# Patient Record
Sex: Female | Born: 1990 | Race: Black or African American | Hispanic: No | Marital: Single | Smoking: Never smoker
Health system: Southern US, Community
[De-identification: ages and names within clinical notes are randomized; demographics above are authoritative.]

## PROBLEM LIST (undated history)

## (undated) ENCOUNTER — Inpatient Hospital Stay (HOSPITAL_COMMUNITY): Payer: Self-pay

## (undated) DIAGNOSIS — F419 Anxiety disorder, unspecified: Secondary | ICD-10-CM

## (undated) DIAGNOSIS — N83209 Unspecified ovarian cyst, unspecified side: Secondary | ICD-10-CM

## (undated) DIAGNOSIS — Z202 Contact with and (suspected) exposure to infections with a predominantly sexual mode of transmission: Secondary | ICD-10-CM

## (undated) DIAGNOSIS — E559 Vitamin D deficiency, unspecified: Secondary | ICD-10-CM

## (undated) DIAGNOSIS — R87629 Unspecified abnormal cytological findings in specimens from vagina: Secondary | ICD-10-CM

## (undated) DIAGNOSIS — Z8619 Personal history of other infectious and parasitic diseases: Secondary | ICD-10-CM

## (undated) DIAGNOSIS — B379 Candidiasis, unspecified: Secondary | ICD-10-CM

## (undated) HISTORY — DX: Personal history of other infectious and parasitic diseases: Z86.19

## (undated) HISTORY — PX: DILATION AND CURETTAGE OF UTERUS: SHX78

## (undated) HISTORY — PX: WISDOM TOOTH EXTRACTION: SHX21

## (undated) HISTORY — DX: Candidiasis, unspecified: B37.9

## (undated) HISTORY — DX: Anxiety disorder, unspecified: F41.9

## (undated) HISTORY — DX: Contact with and (suspected) exposure to infections with a predominantly sexual mode of transmission: Z20.2

## (undated) HISTORY — DX: Vitamin D deficiency, unspecified: E55.9

## (undated) HISTORY — DX: Unspecified ovarian cyst, unspecified side: N83.209

## (undated) HISTORY — DX: Unspecified abnormal cytological findings in specimens from vagina: R87.629

---
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
## 2010-01-02 ENCOUNTER — Inpatient Hospital Stay (HOSPITAL_COMMUNITY): Admission: AD | Admit: 2010-01-02 | Discharge: 2010-01-05 | Payer: Self-pay | Admitting: Obstetrics and Gynecology

## 2010-11-20 LAB — CBC
HCT: 31.9 % — ABNORMAL LOW (ref 36.0–46.0)
MCV: 90.7 fL (ref 78.0–100.0)
Platelets: 206 10*3/uL (ref 150–400)
RBC: 3.99 MIL/uL (ref 3.87–5.11)
RDW: 13.5 % (ref 11.5–15.5)
WBC: 15.4 10*3/uL — ABNORMAL HIGH (ref 4.0–10.5)

## 2010-11-20 LAB — CCBB MATERNAL DONOR DRAW

## 2010-12-06 LAB — URINALYSIS, ROUTINE W REFLEX MICROSCOPIC
Bilirubin Urine: NEGATIVE
Glucose, UA: NEGATIVE mg/dL
Ketones, ur: 40 mg/dL — AB
Leukocytes, UA: NEGATIVE
Leukocytes, UA: NEGATIVE
Nitrite: NEGATIVE
Protein, ur: NEGATIVE mg/dL
Specific Gravity, Urine: 1.015 (ref 1.005–1.030)
Urobilinogen, UA: 0.2 mg/dL (ref 0.0–1.0)
Urobilinogen, UA: 0.2 mg/dL (ref 0.0–1.0)
pH: 7 (ref 5.0–8.0)
pH: 6 (ref 5.0–8.0)

## 2010-12-06 LAB — WET PREP, GENITAL
Trich, Wet Prep: NONE SEEN
Yeast Wet Prep HPF POC: NONE SEEN

## 2010-12-06 LAB — URINE MICROSCOPIC-ADD ON

## 2010-12-06 LAB — GC/CHLAMYDIA PROBE AMP, GENITAL: Chlamydia, DNA Probe: NEGATIVE

## 2010-12-07 LAB — URINE MICROSCOPIC-ADD ON

## 2010-12-07 LAB — WET PREP, GENITAL
Trich, Wet Prep: NONE SEEN
Yeast Wet Prep HPF POC: NONE SEEN

## 2010-12-07 LAB — URINALYSIS, ROUTINE W REFLEX MICROSCOPIC
Glucose, UA: NEGATIVE mg/dL
Specific Gravity, Urine: 1.025 (ref 1.005–1.030)
Urobilinogen, UA: 0.2 mg/dL (ref 0.0–1.0)
pH: 6 (ref 5.0–8.0)

## 2010-12-07 LAB — CBC
MCHC: 33.8 g/dL (ref 31.0–37.0)
Platelets: 283 10*3/uL (ref 150–400)
RDW: 12.6 % (ref 11.4–15.5)

## 2010-12-07 LAB — GC/CHLAMYDIA PROBE AMP, GENITAL
Chlamydia, DNA Probe: NEGATIVE
GC Probe Amp, Genital: NEGATIVE

## 2010-12-11 LAB — URINE CULTURE

## 2010-12-11 LAB — URINALYSIS, ROUTINE W REFLEX MICROSCOPIC
Glucose, UA: NEGATIVE mg/dL
Ketones, ur: NEGATIVE mg/dL
Leukocytes, UA: NEGATIVE
Nitrite: NEGATIVE
Specific Gravity, Urine: 1.015 (ref 1.005–1.030)
pH: 7 (ref 5.0–8.0)

## 2010-12-11 LAB — URINE MICROSCOPIC-ADD ON: WBC, UA: NONE SEEN WBC/hpf (ref <3)

## 2011-06-07 LAB — WET PREP, GENITAL

## 2011-06-07 LAB — URINE CULTURE

## 2011-06-07 LAB — URINALYSIS, ROUTINE W REFLEX MICROSCOPIC
Glucose, UA: NEGATIVE mg/dL
Ketones, ur: NEGATIVE mg/dL
Leukocytes, UA: NEGATIVE
Nitrite: NEGATIVE
pH: 8 (ref 5.0–8.0)

## 2011-06-07 LAB — URINE MICROSCOPIC-ADD ON

## 2011-06-07 LAB — PREGNANCY, URINE: Preg Test, Ur: NEGATIVE

## 2011-06-07 LAB — GC/CHLAMYDIA PROBE AMP, GENITAL: Chlamydia, DNA Probe: NEGATIVE

## 2012-03-27 ENCOUNTER — Ambulatory Visit: Payer: Self-pay | Admitting: Obstetrics and Gynecology

## 2012-04-06 ENCOUNTER — Encounter: Payer: Medicaid Other | Admitting: Obstetrics and Gynecology

## 2012-05-25 ENCOUNTER — Encounter: Payer: Self-pay | Admitting: Obstetrics and Gynecology

## 2012-05-25 ENCOUNTER — Ambulatory Visit (INDEPENDENT_AMBULATORY_CARE_PROVIDER_SITE_OTHER): Payer: Medicaid Other | Admitting: Obstetrics and Gynecology

## 2012-05-25 ENCOUNTER — Other Ambulatory Visit (INDEPENDENT_AMBULATORY_CARE_PROVIDER_SITE_OTHER): Payer: Medicaid Other

## 2012-05-25 VITALS — BP 100/72 | HR 72 | Ht 66.5 in | Wt 128.0 lb

## 2012-05-25 DIAGNOSIS — Z3009 Encounter for other general counseling and advice on contraception: Secondary | ICD-10-CM

## 2012-05-25 DIAGNOSIS — Z3040 Encounter for surveillance of contraceptives, unspecified: Secondary | ICD-10-CM

## 2012-05-25 DIAGNOSIS — Z00129 Encounter for routine child health examination without abnormal findings: Secondary | ICD-10-CM

## 2012-05-25 DIAGNOSIS — Z01419 Encounter for gynecological examination (general) (routine) without abnormal findings: Secondary | ICD-10-CM

## 2012-05-25 DIAGNOSIS — Z3049 Encounter for surveillance of other contraceptives: Secondary | ICD-10-CM

## 2012-05-25 DIAGNOSIS — Z113 Encounter for screening for infections with a predominantly sexual mode of transmission: Secondary | ICD-10-CM

## 2012-05-25 DIAGNOSIS — Z3042 Encounter for surveillance of injectable contraceptive: Secondary | ICD-10-CM

## 2012-05-25 DIAGNOSIS — F419 Anxiety disorder, unspecified: Secondary | ICD-10-CM

## 2012-05-25 DIAGNOSIS — N898 Other specified noninflammatory disorders of vagina: Secondary | ICD-10-CM

## 2012-05-25 DIAGNOSIS — F411 Generalized anxiety disorder: Secondary | ICD-10-CM

## 2012-05-25 LAB — POCT OSOM BVBLUE TEST: Bacterial Vaginosis: NEGATIVE

## 2012-05-25 LAB — POCT URINE PREGNANCY: Preg Test, Ur: NEGATIVE

## 2012-05-25 LAB — POCT WET PREP (WET MOUNT): Trichomonas Wet Prep HPF POC: NEGATIVE

## 2012-05-25 LAB — POCT OSOM TRICHOMONAS RAPID TEST: Trichomonas vaginalis: NEGATIVE

## 2012-05-25 MED ORDER — SERTRALINE HCL 25 MG PO TABS: 25.0000 mg | ORAL_TABLET | Freq: Every day | ORAL | Status: DC

## 2012-05-25 MED ORDER — MEDROXYPROGESTERONE ACETATE 150 MG/ML IM SUSP: 150.0000 mg | Freq: Once | INTRAMUSCULAR | Status: DC

## 2012-05-25 MED ORDER — MEDROXYPROGESTERONE ACETATE 150 MG/ML IM SUSP
150.0000 mg | Freq: Once | INTRAMUSCULAR | Status: AC
  Administered 2012-05-25: 150 mg via INTRAMUSCULAR

## 2012-05-25 MED ORDER — SERTRALINE HCL 50 MG PO TABS: 50.0000 mg | ORAL_TABLET | Freq: Every day | ORAL | Status: DC

## 2012-05-25 MED ORDER — CALCIUM-CHOLECALCIFEROL 200-250 MG-UNIT PO TABS: 1.0000 mL | ORAL_TABLET | Freq: Two times a day (BID) | ORAL | Status: DC
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
## 2012-05-25 NOTE — Addendum Note (Signed)
Addended by: Janeece Agee on: 05/25/2012 05:39 PM   Modules accepted: Orders

## 2012-05-25 NOTE — Addendum Note (Signed)
Addended by: Janeece Agee on: 05/25/2012 05:40 PM   Modules accepted: Orders

## 2012-05-25 NOTE — Progress Notes (Unsigned)
Next Depo due 08-16-2012

## 2012-05-26 ENCOUNTER — Encounter: Payer: Self-pay | Admitting: Obstetrics and Gynecology

## 2012-05-26 ENCOUNTER — Other Ambulatory Visit: Payer: Self-pay

## 2012-05-26 ENCOUNTER — Other Ambulatory Visit (INDEPENDENT_AMBULATORY_CARE_PROVIDER_SITE_OTHER): Payer: Medicaid Other

## 2012-05-26 DIAGNOSIS — Z3201 Encounter for pregnancy test, result positive: Secondary | ICD-10-CM

## 2012-05-26 DIAGNOSIS — Z3200 Encounter for pregnancy test, result unknown: Secondary | ICD-10-CM

## 2012-05-26 LAB — GC/CHLAMYDIA PROBE AMP, GENITAL: GC Probe Amp, Genital: NEGATIVE

## 2012-05-26 NOTE — Progress Notes (Signed)
A user error has taken place: encounter opened in error, closed for administrative reasons.

## 2012-06-05 HISTORY — PX: INDUCED ABORTION: SHX677

## 2012-07-02 ENCOUNTER — Encounter: Payer: Medicaid Other | Admitting: Obstetrics and Gynecology

## 2012-07-07 ENCOUNTER — Encounter: Payer: Medicaid Other | Admitting: Obstetrics and Gynecology

## 2012-08-31 ENCOUNTER — Telehealth: Payer: Self-pay | Admitting: Obstetrics and Gynecology

## 2012-08-31 NOTE — Telephone Encounter (Signed)
Pt needing f/u for TAB that she had done in [place]. States that TAB was done on 06/05/12, has had issues with scheduling f/u apt.  Apt scheduled for 09/10/12 @ 4:00 p.m.  Pt voiced understanding  Darien Ramus, CMA

## 2012-09-10 ENCOUNTER — Encounter: Payer: Medicaid Other | Admitting: Obstetrics and Gynecology

## 2012-09-28 ENCOUNTER — Encounter: Payer: Self-pay | Admitting: Obstetrics and Gynecology

## 2012-09-28 ENCOUNTER — Ambulatory Visit: Payer: Medicaid Other | Admitting: Obstetrics and Gynecology

## 2012-09-28 VITALS — BP 98/58 | Ht 66.0 in | Wt 134.0 lb

## 2012-09-28 DIAGNOSIS — F419 Anxiety disorder, unspecified: Secondary | ICD-10-CM

## 2012-09-28 DIAGNOSIS — F4321 Adjustment disorder with depressed mood: Secondary | ICD-10-CM

## 2012-09-28 DIAGNOSIS — Z309 Encounter for contraceptive management, unspecified: Secondary | ICD-10-CM

## 2012-09-28 MED ORDER — NAPROXEN SODIUM 550 MG PO TABS: ORAL_TABLET | ORAL | Status: DC

## 2012-09-28 MED ORDER — MEDROXYPROGESTERONE ACETATE 150 MG/ML IM SUSP
150.0000 mg | Freq: Once | INTRAMUSCULAR | Status: AC
  Administered 2012-09-28: 150 mg via INTRAMUSCULAR

## 2012-09-28 MED ORDER — MEDROXYPROGESTERONE ACETATE 150 MG/ML IM SUSP: 150.0000 mg | INTRAMUSCULAR | Status: DC

## 2012-09-28 NOTE — Progress Notes (Addendum)
21 YO S/P EAB 06/05/12 requesting Depo Provera.  Menses x  7 days with pad change every hour with cramping 7/10 relived with OTC analgesia..  Over the past year lost her mother and has been having anxiety and is very emotional.  Additionally she was notified she was pregnant after receiving her Depo Provera in October  consequently had EAB.  Patient has a history of anxiety, was on Klonipin in the past and Geodon that caused bad side effects. Has problems staying asleep and all of these symptoms have been worse since October.  Denies suicidal or homocidal ideations.    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

## 2012-10-02 ENCOUNTER — Telehealth: Payer: Self-pay | Admitting: Obstetrics and Gynecology

## 2012-10-02 NOTE — Telephone Encounter (Signed)
TC to obtain prior auth for Depo.  After 9 min on phone call informed pt's Rx was filled at [facility] 09/28/12. Too soon to authorize Rf.

## 2012-10-29 ENCOUNTER — Telehealth: Payer: Self-pay | Admitting: Obstetrics and Gynecology

## 2012-10-29 NOTE — Telephone Encounter (Signed)
See message.

## 2012-10-30 NOTE — Telephone Encounter (Signed)
Patient was referred to [facility] for management of anxiety and associated problems however, a call from [place] at [facility] stated that patient did not return calls that were made to schedule patient. Note is being made of this occurence.  Omelia Marquart, PA-C

## 2013-03-22 ENCOUNTER — Emergency Department (HOSPITAL_COMMUNITY)
Admission: EM | Admit: 2013-03-22 | Discharge: 2013-03-23 | Discharge status: Against Medical Advice | Payer: Medicaid Other | Attending: Emergency Medicine | Admitting: Emergency Medicine

## 2013-03-22 ENCOUNTER — Encounter (HOSPITAL_COMMUNITY): Payer: Self-pay | Admitting: *Deleted

## 2013-03-22 DIAGNOSIS — R110 Nausea: Secondary | ICD-10-CM | POA: Insufficient documentation

## 2013-03-22 DIAGNOSIS — F319 Bipolar disorder, unspecified: Secondary | ICD-10-CM | POA: Insufficient documentation

## 2013-03-22 DIAGNOSIS — Z8742 Personal history of other diseases of the female genital tract: Secondary | ICD-10-CM | POA: Insufficient documentation

## 2013-03-22 DIAGNOSIS — K08109 Complete loss of teeth, unspecified cause, unspecified class: Secondary | ICD-10-CM | POA: Insufficient documentation

## 2013-03-22 DIAGNOSIS — Z8619 Personal history of other infectious and parasitic diseases: Secondary | ICD-10-CM | POA: Insufficient documentation

## 2013-03-22 DIAGNOSIS — K0889 Other specified disorders of teeth and supporting structures: Secondary | ICD-10-CM

## 2013-03-22 DIAGNOSIS — R350 Frequency of micturition: Secondary | ICD-10-CM | POA: Insufficient documentation

## 2013-03-22 DIAGNOSIS — Z3490 Encounter for supervision of normal pregnancy, unspecified, unspecified trimester: Secondary | ICD-10-CM

## 2013-03-22 DIAGNOSIS — R300 Dysuria: Secondary | ICD-10-CM | POA: Insufficient documentation

## 2013-03-22 DIAGNOSIS — Z331 Pregnant state, incidental: Secondary | ICD-10-CM | POA: Insufficient documentation

## 2013-03-22 DIAGNOSIS — Z79899 Other long term (current) drug therapy: Secondary | ICD-10-CM | POA: Insufficient documentation

## 2013-03-22 DIAGNOSIS — N898 Other specified noninflammatory disorders of vagina: Secondary | ICD-10-CM | POA: Insufficient documentation

## 2013-03-22 DIAGNOSIS — K089 Disorder of teeth and supporting structures, unspecified: Secondary | ICD-10-CM | POA: Insufficient documentation

## 2013-03-22 NOTE — ED Notes (Signed)
C/o abd pain x 1 week with some vaginal d/c last week- some discomfort with urination- also c/o left lower tooth pain

## 2013-03-23 ENCOUNTER — Emergency Department (HOSPITAL_COMMUNITY): Payer: Medicaid Other

## 2013-03-23 LAB — URINE MICROSCOPIC-ADD ON

## 2013-03-23 LAB — WET PREP, GENITAL
Trich, Wet Prep: NONE SEEN
Yeast Wet Prep HPF POC: NONE SEEN

## 2013-03-23 LAB — URINALYSIS, ROUTINE W REFLEX MICROSCOPIC
Glucose, UA: NEGATIVE mg/dL
Specific Gravity, Urine: 1.018 (ref 1.005–1.030)
pH: 6 (ref 5.0–8.0)

## 2013-03-23 MED ORDER — BUPIVACAINE-EPINEPHRINE PF 0.5-1:200000 % IJ SOLN
20.0000 mL | Freq: Once | INTRAMUSCULAR | Status: AC
  Administered 2013-03-23: 100 mg
  Filled 2013-03-23 (×2): qty 20

## 2013-03-23 MED ORDER — CEPHALEXIN 500 MG PO CAPS: 500.0000 mg | ORAL_CAPSULE | Freq: Once | ORAL | Status: DC

## 2013-03-23 NOTE — ED Provider Notes (Addendum)
History    CSN: [id number] Arrival date & time 03/22/13  2339  First MD Initiated Contact with Patient 03/23/13 0100     Chief Complaint  Patient presents with  . Abdominal Pain  . Dental Pain   HPI Jaime Leonard is a 22 y.o. female who presents with multiple complaints. Patient has had dental pain for weeks, she said she had a dental appointment today but "the office did not call me to remind me, so I did not go" she's been taking ibuprofen for the severe pain, but it has not got any better. She says the whole left side of her face hurts, no fevers, chills, tongue swelling, difficulty swallowing or breathing.  She's also complaining about dysuria, a weird feeling and she complains about frequency. She says her last menstrual period was May 30, she was supposed to get a reinjection of her birth control medicine in April however did not and she remains sexually active. She's had an increase in vaginal discharge which is described as white with some watery fluid, no rushing or fluid, no vaginal bleeding. She has occasional nausea, especially when smelling pizza, no vomiting, diarrhea. No other abdominal pain. No shortness of breath or chest pain.     Past Medical History  Diagnosis Date  . History of chicken pox   . Yeast infection   . H/O bipolar disorder   . Ovarian cyst    Past Surgical History  Procedure Laterality Date  . Wisdom tooth extraction    . Induced abortion  06/05/2012    elective   Family History  Problem Relation Age of Onset  . Hypertension Mother   . Cancer Mother   . Hypertension Maternal Grandmother   . Diabetes Maternal Grandmother   . Kidney failure Maternal Grandmother   . Stroke Maternal Grandmother   . Diabetes Paternal Grandmother   . Diabetes Father    History  Substance Use Topics  . Smoking status: Never Smoker   . Smokeless tobacco: Never Used  . Alcohol Use: 0.5 oz/week    1 drink(s) per week   OB History   Grav Para Term Preterm  Abortions TAB SAB Ect Mult Living   2 1   1 1    1      Review of Systems At least 10pt or greater review of systems completed and are negative except where specified in the HPI.  Allergies  Shellfish allergy  Home Medications   Current Outpatient Rx  Name  Route  Sig  Dispense  Refill  . Calcium-Cholecalciferol (CALCET PETITES) 200-250 MG-UNIT TABS   Oral   Take 1 mL by mouth 2 (two) times daily.   60 tablet   3   . medroxyPROGESTERone (DEPO-PROVERA) 150 MG/ML injection   Intramuscular   Inject 1 mL (150 mg total) into the muscle every 3 (three) months.   1 mL   4   . medroxyPROGESTERone (DEPO-PROVERA) 150 MG/ML injection   Intramuscular   Inject 1 mL (150 mg total) into the muscle every 3 (three) months.   1 mL   4   . naproxen sodium (ANAPROX DS) 550 MG tablet      1 po pc bid x 2 days then as needed for cramps   60 tablet   3   . sertraline (ZOLOFT) 25 MG tablet   Oral   Take 1 tablet (25 mg total) by mouth daily.   7 tablet   0    BP 109/62  Pulse 80  Temp(Src) 98.5 F (36.9 C) (Oral)  Resp 18  Ht 5\' 7"  (1.702 m)  Wt 131 lb (59.421 kg)  BMI 20.51 kg/m2  SpO2 100%  LMP 01/22/2013 Physical Exam  HENT:  Mouth/Throat:      Nursing notes reviewed.  Electronic medical record reviewed. VITAL SIGNS:   Filed Vitals:   03/22/13 2353  BP: 109/62  Pulse: 80  Temp: 98.5 F (36.9 C)  TempSrc: Oral  Resp: 18  Height: 5\' 7"  (1.702 m)  Weight: 131 lb (59.421 kg)  SpO2: 100%   CONSTITUTIONAL: Awake, oriented, appears non-toxic HENT: Atraumatic, normocephalic, oral mucosa pink and moist, airway patent.  No erythema or exudate. Caries as depicted. No obvious fluctuance in gums or jaw. Nares patent without drainage. External ears normal. EYES: Conjunctiva clear, EOMI, PERRLA NECK: Trachea midline, non-tender, supple CARDIOVASCULAR: Normal heart rate, Normal rhythm, No murmurs, rubs, gallops PULMONARY/CHEST: Clear to auscultation, no rhonchi, wheezes, or  rales. Symmetrical breath sounds. Non-tender. ABDOMINAL: Non-distended, soft, non-tender - no rebound or guarding.  BS normal. NEUROLOGIC: Non-focal, moving all four extremities, no gross sensory or motor deficits. EXTREMITIES: No clubbing, cyanosis, or edema SKIN: Warm, Dry, No erythema, No rash  ED Course  Dental Date/Time: 03/23/2013 2:47 AM Performed by: Jones Skene Authorized by: Jones Skene Consent: Verbal consent obtained. Risks and benefits: risks, benefits and alternatives were discussed Consent given by: patient Patient identity confirmed: verbally with patient Local anesthesia used: yes Local anesthetic: bupivacaine 0.5% with epinephrine Patient sedated: no Patient tolerance: Patient tolerated the procedure well with no immediate complications.   Retromolar fossa was palpated, local anesthetic was applied topically to this area prior to injection. Greatest depth of the anterior border of the ramus was identified and the coronoid notch, approaching from contralateral side with a control syringe 27-gauge needle was inserted at this point and inserted until bone was obtained, backed off about 1 mm, plunger was withdrawn with no blood found and 2 cc of half percent Marcaine with epinephrine were injected.   (including critical care time) Labs Reviewed  WET PREP, GENITAL - Abnormal; Notable for the following:    Clue Cells Wet Prep HPF POC RARE (*)    WBC, Wet Prep HPF POC FEW (*)    All other components within normal limits  PREGNANCY, URINE - Abnormal; Notable for the following:    Preg Test, Ur POSITIVE (*)    All other components within normal limits  URINALYSIS, ROUTINE W REFLEX MICROSCOPIC - Abnormal; Notable for the following:    APPearance CLOUDY (*)    Hgb urine dipstick TRACE (*)    Leukocytes, UA MODERATE (*)    All other components within normal limits  URINE MICROSCOPIC-ADD ON - Abnormal; Notable for the following:    Bacteria, UA MANY (*)    All other  components within normal limits  GC/CHLAMYDIA PROBE AMP  URINE CULTURE   No results found. US Ob Comp Less 14 Wks  03/23/2013   *RADIOLOGY REPORT*  Clinical Data: Abdominal pain.  Estimated gestational age by LMP is 3 weeks 4 days.  OBSTETRIC <14 WK [place] AND TRANSVAGINAL OB US  Technique:  Both transabdominal and transvaginal ultrasound examinations were performed for complete evaluation of the gestation as well as the maternal uterus, adnexal regions, and pelvic cul-de-sac.  Transvaginal technique was performed to assess early pregnancy.  Comparison:  None.  Intrauterine gestational sac:  No intrauterine pregnancy is demonstrated.  Given the early estimated gestational age, this may be too  early to visualize. Yolk sac: Not visualized Embryo: Not visualized Cardiac Activity: Not visualized  Maternal uterus/adnexae: Prominent venous lakes identified in the uterus.  Uterus is anteverted.  No myometrial mass lesions.  Endometrial stripe thickness is normal, measuring about 5 mm diameter.  The right ovary measures 2.7 x 1.1 x 1.8 cm.  Normal follicular changes.  The right ovary measures 3.2 x 2 x 3 cm.  Small simple cyst measuring 1.7 x 1.5 x 2.1 cm is likely a corpus luteum cyst. Prominent varicoceles present.  No abnormal adnexal masses.  Small amount of free fluid in the pelvis.  IMPRESSION: No intrauterine pregnancy is demonstrated.  This could be due to early gestational age, with the gestational sac too small to see. No positive findings of ectopic pregnancy are demonstrated. However, without documentation of intrauterine pregnancy, a occult ectopic pregnancy is not excluded.  Changes can also be due to recent spontaneous abortion. Recommend follow-up with serial quantitative beta HCG levels and short-term ultrasound followup in 2-3 weeks or as indicated clinically.   Original Report Authenticated By: Burman Nieves, M.D.   US Ob Transvaginal  03/23/2013   *RADIOLOGY REPORT*  Clinical Data: Abdominal pain.   Estimated gestational age by LMP is 3 weeks 4 days.  OBSTETRIC <14 WK [place] AND TRANSVAGINAL OB US  Technique:  Both transabdominal and transvaginal ultrasound examinations were performed for complete evaluation of the gestation as well as the maternal uterus, adnexal regions, and pelvic cul-de-sac.  Transvaginal technique was performed to assess early pregnancy.  Comparison:  None.  Intrauterine gestational sac:  No intrauterine pregnancy is demonstrated.  Given the early estimated gestational age, this may be too early to visualize. Yolk sac: Not visualized Embryo: Not visualized Cardiac Activity: Not visualized  Maternal uterus/adnexae: Prominent venous lakes identified in the uterus.  Uterus is anteverted.  No myometrial mass lesions.  Endometrial stripe thickness is normal, measuring about 5 mm diameter.  The right ovary measures 2.7 x 1.1 x 1.8 cm.  Normal follicular changes.  The right ovary measures 3.2 x 2 x 3 cm.  Small simple cyst measuring 1.7 x 1.5 x 2.1 cm is likely a corpus luteum cyst. Prominent varicoceles present.  No abnormal adnexal masses.  Small amount of free fluid in the pelvis.  IMPRESSION: No intrauterine pregnancy is demonstrated.  This could be due to early gestational age, with the gestational sac too small to see. No positive findings of ectopic pregnancy are demonstrated. However, without documentation of intrauterine pregnancy, a occult ectopic pregnancy is not excluded.  Changes can also be due to recent spontaneous abortion. Recommend follow-up with serial quantitative beta HCG levels and short-term ultrasound followup in 2-3 weeks or as indicated clinically.   Original Report Authenticated By: Burman Nieves, M.D.   1. Pregnancy   2. Pain, dental     MDM  Jaime Leonard is a 22 y.o. female who presents with dental pain and some vaginal discharge also some dysuria and lower abdominal pressure. Patient is found to be pregnant, was sent for an ultrasound to assess for intra-  uterine pregnancy. Prior to going to ultrasound, patient was treated for her dental pain with a dental block.  On returning to the emergency department, she informed the nurse that she wanted to leave AGAINST MEDICAL ADVICE, she did not stay in order for me to speak with her regarding the results of her ultrasound.  I called the patient on 03/25/2013 at 08 17 - left a message on the patient's  voice mail that she will need a followup ultrasound in 2-3 weeks to assess for intrauterine pregnancy, in addition the patient has any pain, bleeding, nausea vomiting or any other concerning symptoms she should return to the emergency department immediately. I also encouraged her to call the emergency department to speak with a medical provider.   Jones Skene, MD 03/25/13 1610  Jones Skene, MD 04/02/13 2300

## 2013-03-23 NOTE — ED Notes (Signed)
Encouraged patient to stay for d/c paper work. Reports that "I know what the problem is now and I want to go because my child is with someone else and I already have an appointment with who I need" The patient walking out  - encouraged to stay and reports that she does not want to stay any further. The patient adementaly does not want to stay and states that she has all the follow up that she needs

## 2013-03-24 LAB — GC/CHLAMYDIA PROBE AMP
CT Probe RNA: NEGATIVE
GC Probe RNA: NEGATIVE

## 2013-03-25 LAB — URINE CULTURE: Colony Count: >=100000

## 2013-03-26 ENCOUNTER — Telehealth (HOSPITAL_COMMUNITY): Payer: Self-pay | Admitting: *Deleted

## 2013-03-26 NOTE — ED Notes (Signed)
Patient informed of positive results  And requests that rx be called to [facility] [phone].Called by Carollee Herter PFM

## 2013-03-26 NOTE — Progress Notes (Signed)
ED Antimicrobial Stewardship Positive Culture Follow Up   Jaime Leonard is an 22 y.o. female who presented to [facility] on 03/22/2013 with a chief complaint of  Chief Complaint  Patient presents with  . Abdominal Pain  . Dental Pain    Recent Results (from the past 720 hour(s))  URINE CULTURE     Status: None   Collection Time    03/23/13 12:16 AM      Result Value Range Status   Specimen Description URINE, CLEAN CATCH   Final   Special Requests NONE   Final   Culture  Setup Time 03/23/2013 05:07   Final   Colony Count >=100,000 COLONIES/ML   Final   Culture ESCHERICHIA COLI   Final   Report Status 03/25/2013 FINAL   Final   Organism ID, Bacteria ESCHERICHIA COLI   Final  WET PREP, GENITAL     Status: Abnormal   Collection Time    03/23/13  1:47 AM      Result Value Range Status   Yeast Wet Prep HPF POC NONE SEEN  NONE SEEN Final   Trich, Wet Prep NONE SEEN  NONE SEEN Final   Clue Cells Wet Prep HPF POC RARE (*) NONE SEEN Final   WBC, Wet Prep HPF POC FEW (*) NONE SEEN Final  GC/CHLAMYDIA PROBE AMP     Status: None   Collection Time    03/23/13  1:48 AM      Result Value Range Status   CT Probe RNA NEGATIVE  NEGATIVE Final   GC Probe RNA NEGATIVE  NEGATIVE Final   Comment: (NOTE)                                                                                              **Normal Reference Range: Negative**          Assay performed using the Gen-Probe APTIMA COMBO2 (R) Assay.     Acceptable specimen types for this assay include APTIMA Swabs (Unisex,     endocervical, urethral, or vaginal), first void urine, and ThinPrep     liquid based cytology samples.     [x]  Patient discharged originally without antimicrobial agent and treatment is now indicated  New antibiotic prescription: Macrobid 100mg  po BID x 5 days  ED Provider: Magnus Sinning, PAC   Mickeal Skinner 03/26/2013, 12:00 PM Infectious Diseases Pharmacist Phone# [phone]

## 2013-03-26 NOTE — ED Notes (Signed)
Post ED Visit - Positive Culture Follow-up: Successful Patient Follow-Up  Culture assessed and recommendations reviewed by: []  Wes Dulaney, Pharm.D., BCPS [x]  Celedonio Miyamoto, Pharm.D., BCPS []  Georgina Pillion, Pharm.D., BCPS []  [place], [place].D., BCPS, AAHIVP []  Estella Husk, Pharm.D., BCPS, AAHIVP  Positive Urine culture  []  Patient discharged without antimicrobial prescription and treatment is now indicated [x]  Organism is resistant to prescribed ED discharge antimicrobial []  Patient with positive blood cultures  Changes discussed with ED provider :Pascal Lux Wingen New antibiotic prescription Macrobid 100 mg po BID x 5 days     Larena Sox 03/26/2013, 1:42 PM

## 2013-05-10 ENCOUNTER — Encounter (HOSPITAL_COMMUNITY)
Admission: AD | Disposition: A | Discharge status: Home / Self Care | Payer: Self-pay | Source: Home / Self Care | Attending: Emergency Medicine

## 2013-05-10 ENCOUNTER — Encounter (HOSPITAL_COMMUNITY): Payer: Self-pay | Admitting: Certified Registered"

## 2013-05-10 ENCOUNTER — Inpatient Hospital Stay (HOSPITAL_COMMUNITY): Payer: Medicaid Other | Admitting: Certified Registered"

## 2013-05-10 ENCOUNTER — Encounter (HOSPITAL_COMMUNITY): Payer: Self-pay | Admitting: Emergency Medicine

## 2013-05-10 ENCOUNTER — Emergency Department (HOSPITAL_COMMUNITY): Payer: Medicaid Other

## 2013-05-10 ENCOUNTER — Inpatient Hospital Stay (HOSPITAL_COMMUNITY)
Admission: AD | Admit: 2013-05-10 | Discharge: 2013-05-10 | Disposition: A | Discharge status: Home / Self Care | Payer: Medicaid Other | Attending: Obstetrics and Gynecology | Admitting: Obstetrics and Gynecology

## 2013-05-10 DIAGNOSIS — O030 Genital tract and pelvic infection following incomplete spontaneous abortion: Secondary | ICD-10-CM | POA: Insufficient documentation

## 2013-05-10 DIAGNOSIS — N83209 Unspecified ovarian cyst, unspecified side: Secondary | ICD-10-CM | POA: Insufficient documentation

## 2013-05-10 DIAGNOSIS — O034 Incomplete spontaneous abortion without complication: Secondary | ICD-10-CM

## 2013-05-10 DIAGNOSIS — R109 Unspecified abdominal pain: Secondary | ICD-10-CM | POA: Insufficient documentation

## 2013-05-10 HISTORY — PX: DILATION AND EVACUATION: SHX1459

## 2013-05-10 LAB — CBC WITH DIFFERENTIAL/PLATELET
Basophils Absolute: 0 10*3/uL (ref 0.0–0.1)
Basophils Relative: 1 % (ref 0–1)
Eosinophils Absolute: 0.1 10*3/uL (ref 0.0–0.7)
Eosinophils Relative: 2 % (ref 0–5)
HCT: 36.9 % (ref 36.0–46.0)
Hemoglobin: 12.7 g/dL (ref 12.0–15.0)
Lymphocytes Relative: 34 % (ref 12–46)
Lymphs Abs: 2.2 10*3/uL (ref 0.7–4.0)
MCH: 29 pg (ref 26.0–34.0)
MCHC: 34.4 g/dL (ref 30.0–36.0)
MCV: 84.2 fL (ref 78.0–100.0)
Monocytes Absolute: 0.6 10*3/uL (ref 0.1–1.0)
Monocytes Relative: 9 % (ref 3–12)
Neutro Abs: 3.4 10*3/uL (ref 1.7–7.7)
Neutrophils Relative %: 54 % (ref 43–77)
Platelets: 285 10*3/uL (ref 150–400)
RBC: 4.38 MIL/uL (ref 3.87–5.11)
RDW: 12.4 % (ref 11.5–15.5)
WBC: 6.3 10*3/uL (ref 4.0–10.5)

## 2013-05-10 LAB — URINALYSIS, ROUTINE W REFLEX MICROSCOPIC
Bilirubin Urine: NEGATIVE
Glucose, UA: NEGATIVE mg/dL
Hgb urine dipstick: NEGATIVE
Ketones, ur: NEGATIVE mg/dL
Nitrite: NEGATIVE
Protein, ur: NEGATIVE mg/dL
Specific Gravity, Urine: 1.026 (ref 1.005–1.030)
Urobilinogen, UA: 1 mg/dL (ref 0.0–1.0)
pH: 6.5 (ref 5.0–8.0)

## 2013-05-10 LAB — ABO/RH: ABO/RH(D): B POS

## 2013-05-10 LAB — URINE MICROSCOPIC-ADD ON

## 2013-05-10 LAB — WET PREP, GENITAL

## 2013-05-10 LAB — POCT PREGNANCY, URINE: Preg Test, Ur: POSITIVE — AB

## 2013-05-10 SURGERY — DILATION AND EVACUATION, UTERUS
Anesthesia: Monitor Anesthesia Care | Site: Uterus | Wound class: Clean Contaminated

## 2013-05-10 SURGERY — DILATION AND EVACUATION, UTERUS
Anesthesia: Monitor Anesthesia Care

## 2013-05-10 MED ORDER — CITRIC ACID-SODIUM CITRATE 334-500 MG/5ML PO SOLN
30.0000 mL | Freq: Once | ORAL | Status: AC
  Administered 2013-05-10: 30 mL via ORAL

## 2013-05-10 MED ORDER — PROPOFOL 10 MG/ML IV EMUL
INTRAVENOUS | Status: DC | PRN
  Administered 2013-05-10 (×2): 50 mg via INTRAVENOUS
  Administered 2013-05-10: 30 mg via INTRAVENOUS
  Administered 2013-05-10: 50 mg via INTRAVENOUS
  Administered 2013-05-10: 20 mg via INTRAVENOUS
  Administered 2013-05-10: 30 mg via INTRAVENOUS

## 2013-05-10 MED ORDER — FENTANYL CITRATE 0.05 MG/ML IJ SOLN
INTRAMUSCULAR | Status: AC
  Filled 2013-05-10: qty 2

## 2013-05-10 MED ORDER — METRONIDAZOLE IN NACL 5-0.79 MG/ML-% IV SOLN
500.0000 mg | Freq: Once | INTRAVENOUS | Status: AC
  Administered 2013-05-10: 1 g via INTRAVENOUS
  Filled 2013-05-10: qty 100

## 2013-05-10 MED ORDER — PROPOFOL 10 MG/ML IV EMUL
INTRAVENOUS | Status: AC
  Filled 2013-05-10: qty 20

## 2013-05-10 MED ORDER — LIDOCAINE HCL (CARDIAC) 20 MG/ML IV SOLN
INTRAVENOUS | Status: AC
  Filled 2013-05-10: qty 5

## 2013-05-10 MED ORDER — ONDANSETRON 4 MG PO TBDP
4.0000 mg | ORAL_TABLET | Freq: Once | ORAL | Status: AC
  Administered 2013-05-10: 4 mg via ORAL
  Filled 2013-05-10: qty 1

## 2013-05-10 MED ORDER — LACTATED RINGERS IV BOLUS (SEPSIS): 500.0000 mL | Freq: Once | INTRAVENOUS | Status: DC

## 2013-05-10 MED ORDER — FAMOTIDINE IN NACL 20-0.9 MG/50ML-% IV SOLN: 20.0000 mg | Freq: Once | INTRAVENOUS | Status: DC

## 2013-05-10 MED ORDER — LACTATED RINGERS IV SOLN
INTRAVENOUS | Status: DC | PRN
  Administered 2013-05-10: 15:00:00 via INTRAVENOUS

## 2013-05-10 MED ORDER — FENTANYL CITRATE 0.05 MG/ML IJ SOLN
INTRAMUSCULAR | Status: DC | PRN
  Administered 2013-05-10 (×2): 50 ug via INTRAVENOUS

## 2013-05-10 MED ORDER — LIDOCAINE HCL 2 % IJ SOLN
INTRAMUSCULAR | Status: AC
  Filled 2013-05-10: qty 20

## 2013-05-10 MED ORDER — DOXYCYCLINE HYCLATE 100 MG IV SOLR
100.0000 mg | Freq: Once | INTRAVENOUS | Status: AC
  Administered 2013-05-10: 100 mg via INTRAVENOUS
  Filled 2013-05-10: qty 100

## 2013-05-10 MED ORDER — HYDROCODONE-ACETAMINOPHEN 5-325 MG PO TABS: 1.0000 | ORAL_TABLET | Freq: Four times a day (QID) | ORAL | Status: DC | PRN

## 2013-05-10 MED ORDER — IBUPROFEN 600 MG PO TABS: 600.0000 mg | ORAL_TABLET | Freq: Four times a day (QID) | ORAL | Status: DC | PRN

## 2013-05-10 MED ORDER — CITRIC ACID-SODIUM CITRATE 334-500 MG/5ML PO SOLN
ORAL | Status: AC
  Filled 2013-05-10: qty 15

## 2013-05-10 MED ORDER — MIDAZOLAM HCL 2 MG/2ML IJ SOLN
INTRAMUSCULAR | Status: AC
  Filled 2013-05-10: qty 2

## 2013-05-10 MED ORDER — FENTANYL CITRATE 0.05 MG/ML IJ SOLN
INTRAMUSCULAR | Status: AC
  Administered 2013-05-10: 25 ug via INTRAVENOUS
  Filled 2013-05-10: qty 2

## 2013-05-10 MED ORDER — ONDANSETRON HCL 4 MG/2ML IJ SOLN
INTRAMUSCULAR | Status: DC | PRN
  Administered 2013-05-10: 4 mg via INTRAVENOUS

## 2013-05-10 MED ORDER — LIDOCAINE HCL (CARDIAC) 20 MG/ML IV SOLN
INTRAVENOUS | Status: DC | PRN
  Administered 2013-05-10: 80 mg via INTRAVENOUS

## 2013-05-10 MED ORDER — DEXAMETHASONE SODIUM PHOSPHATE 10 MG/ML IJ SOLN
INTRAMUSCULAR | Status: AC
  Filled 2013-05-10: qty 1

## 2013-05-10 MED ORDER — MIDAZOLAM HCL 2 MG/2ML IJ SOLN
INTRAMUSCULAR | Status: DC | PRN
  Administered 2013-05-10: 2 mg via INTRAVENOUS

## 2013-05-10 MED ORDER — FAMOTIDINE IN NACL 20-0.9 MG/50ML-% IV SOLN
20.0000 mg | INTRAVENOUS | Status: AC
  Administered 2013-05-10: 20 mg via INTRAVENOUS

## 2013-05-10 MED ORDER — FENTANYL CITRATE 0.05 MG/ML IJ SOLN
25.0000 ug | INTRAMUSCULAR | Status: DC | PRN
  Administered 2013-05-10: 25 ug via INTRAVENOUS

## 2013-05-10 MED ORDER — DOXYCYCLINE HYCLATE 100 MG PO TABS: 100.0000 mg | ORAL_TABLET | Freq: Two times a day (BID) | ORAL | Status: DC

## 2013-05-10 MED ORDER — DEXAMETHASONE SODIUM PHOSPHATE 10 MG/ML IJ SOLN
INTRAMUSCULAR | Status: DC | PRN
  Administered 2013-05-10: 10 mg via INTRAVENOUS

## 2013-05-10 MED ORDER — KETOROLAC TROMETHAMINE 30 MG/ML IJ SOLN
INTRAMUSCULAR | Status: DC | PRN
  Administered 2013-05-10: 30 mg via INTRAVENOUS

## 2013-05-10 MED ORDER — ONDANSETRON HCL 4 MG/2ML IJ SOLN
INTRAMUSCULAR | Status: AC
  Filled 2013-05-10: qty 2

## 2013-05-10 MED ORDER — FAMOTIDINE IN NACL 20-0.9 MG/50ML-% IV SOLN
INTRAVENOUS | Status: AC
  Filled 2013-05-10: qty 50

## 2013-05-10 MED ORDER — METRONIDAZOLE 500 MG PO TABS: 500.0000 mg | ORAL_TABLET | Freq: Two times a day (BID) | ORAL | Status: DC

## 2013-05-10 MED ORDER — HYDROCODONE-ACETAMINOPHEN 5-325 MG PO TABS
1.0000 | ORAL_TABLET | Freq: Once | ORAL | Status: AC
  Administered 2013-05-10: 1 via ORAL
  Filled 2013-05-10: qty 1

## 2013-05-10 MED ORDER — LIDOCAINE HCL 2 % IJ SOLN
INTRAMUSCULAR | Status: DC | PRN
  Administered 2013-05-10: 10 mL

## 2013-05-10 MED ORDER — ACETAMINOPHEN 160 MG/5ML PO SOLN: 650.0000 mg | Freq: Once | ORAL | Status: DC

## 2013-05-10 SURGICAL SUPPLY — 23 items
CATH ROBINSON RED A/P 16FR (CATHETERS) ×2 IMPLANT
CLOTH BEACON ORANGE TIMEOUT ST (SAFETY) ×2 IMPLANT
DECANTER SPIKE VIAL GLASS SM (MISCELLANEOUS) ×2 IMPLANT
GLOVE BIO SURGEON STRL SZ7.5 (GLOVE) ×2 IMPLANT
GLOVE BIOGEL PI IND STRL 7.5 (GLOVE) ×1 IMPLANT
GLOVE BIOGEL PI INDICATOR 7.5 (GLOVE) ×1
GLOVE INDICATOR 7.0 STRL GRN (GLOVE) ×3 IMPLANT
GLOVE SURG SS PI 7.0 STRL IVOR (GLOVE) ×2 IMPLANT
GOWN STRL REIN XL XLG (GOWN DISPOSABLE) ×4 IMPLANT
KIT BERKELEY 1ST TRIMESTER 3/8 (MISCELLANEOUS) ×2 IMPLANT
NDL SPNL 22GX3.5 QUINCKE BK (NEEDLE) ×1 IMPLANT
NEEDLE SPNL 22GX3.5 QUINCKE BK (NEEDLE) ×2 IMPLANT
NS IRRIG 1000ML POUR BTL (IV SOLUTION) ×2 IMPLANT
PACK VAGINAL MINOR WOMEN LF (CUSTOM PROCEDURE TRAY) ×2 IMPLANT
PAD OB MATERNITY 4.3X12.25 (PERSONAL CARE ITEMS) ×2 IMPLANT
PAD PREP 24X48 CUFFED NSTRL (MISCELLANEOUS) ×2 IMPLANT
SET BERKELEY SUCTION TUBING (SUCTIONS) ×2 IMPLANT
SYR CONTROL 10ML LL (SYRINGE) ×2 IMPLANT
TOWEL OR 17X24 6PK STRL BLUE (TOWEL DISPOSABLE) ×4 IMPLANT
VACURETTE 10 RIGID CVD (CANNULA) IMPLANT
VACURETTE 7MM CVD STRL WRAP (CANNULA) IMPLANT
VACURETTE 8 RIGID CVD (CANNULA) ×1 IMPLANT
VACURETTE 9 RIGID CVD (CANNULA) IMPLANT

## 2013-05-10 NOTE — H&P (Signed)
CSN: [id number]  Arrival date and time: 05/10/13 4098  None  Chief Complaint   Patient presents with   .  Abdominal Pain    HPI  [id number] with LMP 02/26/13 who initially found out she was pregnant in mid July when she presented to [facility] ER for toothache. Pt could not stay for blood work because of childcare issues. She subsequently went for medical ab on 04/13/13 and took the medicine on 04/14/13. She had bleeding, cramping and noticeably passed the sac within that week. She was fine until this week when she had increased cramping and spotting that worsened today and woke her up from sleep. She presented to [facility] ER and u/s showed retained POCs. She also has BV and trich on the wet prep.  OB History    Grav  Para  Term  Preterm  Abortions  TAB  SAB  Ect  Mult  Living    3  1    1  1     1       Past Medical History   Diagnosis  Date   .  History of chicken pox    .  Yeast infection    .  H/O bipolar disorder    .  Ovarian cyst     Past Surgical History   Procedure  Laterality  Date   .  Wisdom tooth extraction     .  Induced abortion   06/05/2012     elective    Family History   Problem  Relation  Age of Onset   .  Hypertension  Mother    .  Cancer  Mother    .  Hypertension  Maternal Grandmother    .  Diabetes  Maternal Grandmother    .  Kidney failure  Maternal Grandmother    .  Stroke  Maternal Grandmother    .  Diabetes  Paternal Grandmother    .  Diabetes  Father     History   Substance Use Topics   .  Smoking status:  Never Smoker   .  Smokeless tobacco:  Never Used   .  Alcohol Use:  0.5 oz/week     1 drink(s) per week    Allergies:  Allergies   Allergen  Reactions   .  Shellfish Allergy     Prescriptions prior to admission   Medication  Sig  Dispense  Refill   .  acetaminophen (TYLENOL) 500 MG tablet  Take 500 mg by mouth every 6 (six) hours as needed for pain.      ROS  Denies fever or chills. She had nausea and vomiting this week.  Physical Exam   Blood pressure  102/56, pulse 53, temperature 97.9 F (36.6 C), temperature source Oral, resp. rate 18, height 5\' 7"  (1.702 m), weight 58.968 kg (130 lb), last menstrual period 01/22/2013, SpO2 98.00%.  Physical Exam  Lungs CTA  CV RRR  Abd soft, midline pelvic tenderness  Ext no calf tenderness  VE closed and +tenderness  MAU Course   Procedures  U/s with retained POCs, thickened endometrium and 2 small bilateral ovarian cysts each less than 2cm  CBC - wnl and Blood type B+  Wet prep - + trich and + BV  GC/CT pending  Declined remainder of STD w/u and said she was recently tested for HIV and it was negative  Quant 700+  Assessment and Plan   P1 at 10 3/7 wks with retained POCs, trich and BV.  Will treat with doxy and flagy for a week. I discussed options and pt wants to proceed with Suction D&C and antibiotics. I explained the risks of bleeding, infection and injury with possibility of developing PID. Instructed pt to have partner treated and use condoms. Pt is agreeable and wants to proceed.  Purcell Nails  05/10/2013, 2:29 PM

## 2013-05-10 NOTE — ED Notes (Signed)
Bed: WA09 Expected date:  Expected time:  Means of arrival:  Comments: ems-21yo-abd pain

## 2013-05-10 NOTE — Transfer of Care (Signed)
Immediate Anesthesia Transfer of Care Note  Patient: Jaime Leonard  Procedure(s) Performed: Procedure(s): DILATATION AND EVACUATION (N/A)  Patient Location: PACU  Anesthesia Type:MAC  Level of Consciousness: awake, alert  and oriented  Airway & Oxygen Therapy: Patient Spontanous Breathing and Patient connected to nasal cannula oxygen  Post-op Assessment: Report given to PACU RN, Post -op Vital signs reviewed and stable and Patient moving all extremities  Post vital signs: Reviewed and stable  Complications: No apparent anesthesia complications

## 2013-05-10 NOTE — Preoperative (Signed)
Beta Blockers   Reason not to administer Beta Blockers:Not Applicable 

## 2013-05-10 NOTE — ED Provider Notes (Signed)
CSN: [id number]     Arrival date & time 05/10/13  7829 History   First MD Initiated Contact with Patient 05/10/13 [phone]     Chief Complaint  Patient presents with  . Abdominal Pain    HPI   Patient with a therapeutic abortion on August 8. For 4 weeks ago. She was at 4-[redacted] weeks gestation at the time. She states she had bleeding for about a week. Intermittent spotting since that time maybe one day per week. She has not resumed sexual activity. She had some brown discharge yesterday. She  pain and cramping today. She states it is somewhat white count menstrual cramping but has not had menstrual bleeding. At one point the pain was severe and she became nauseated and vomited. Otherwise good appetite last several days and weeks. No fevers. Past Medical History  Diagnosis Date  . History of chicken pox   . Yeast infection   . H/O bipolar disorder   . Ovarian cyst    Past Surgical History  Procedure Laterality Date  . Wisdom tooth extraction    . Induced abortion  06/05/2012    elective   Family History  Problem Relation Age of Onset  . Hypertension Mother   . Cancer Mother   . Hypertension Maternal Grandmother   . Diabetes Maternal Grandmother   . Kidney failure Maternal Grandmother   . Stroke Maternal Grandmother   . Diabetes Paternal Grandmother   . Diabetes Father    History  Substance Use Topics  . Smoking status: Never Smoker   . Smokeless tobacco: Never Used  . Alcohol Use: 0.5 oz/week    1 drink(s) per week   OB History   Grav Para Term Preterm Abortions TAB SAB Ect Mult Living   2 1   1 1    1      Review of Systems  Constitutional: Negative for fever, chills, diaphoresis, appetite change and fatigue.  HENT: Negative for sore throat, mouth sores and trouble swallowing.   Eyes: Negative for visual disturbance.  Respiratory: Negative for cough, chest tightness, shortness of breath and wheezing.   Cardiovascular: Negative for chest pain.  Gastrointestinal: Positive for  abdominal pain. Negative for nausea, vomiting, diarrhea and abdominal distention.  Endocrine: Negative for polydipsia, polyphagia and polyuria.  Genitourinary: Positive for vaginal discharge, menstrual problem and pelvic pain. Negative for dysuria, frequency, hematuria and dyspareunia.  Musculoskeletal: Negative for gait problem.  Skin: Negative for color change, pallor and rash.  Neurological: Negative for dizziness, syncope, light-headedness and headaches.  Hematological: Does not bruise/bleed easily.  Psychiatric/Behavioral: Negative for behavioral problems and confusion.    Allergies  Shellfish allergy  Home Medications   Current Outpatient Rx  Name  Route  Sig  Dispense  Refill  . acetaminophen (TYLENOL) 500 MG tablet   Oral   Take 500 mg by mouth every 6 (six) hours as needed for pain.           BP 118/59  Pulse 63  Temp(Src) 97.5 F (36.4 C) (Oral)  Resp 17  SpO2 98% Physical Exam  Constitutional: She is oriented to person, place, and time. She appears well-developed and well-nourished. No distress.  HENT:  Head: Normocephalic.  Eyes: Conjunctivae are normal. Pupils are equal, round, and reactive to light. No scleral icterus.  Neck: Normal range of motion. Neck supple. No thyromegaly present.  Cardiovascular: Normal rate and regular rhythm.  Exam reveals no gallop and no friction rub.   No murmur heard. Pulmonary/Chest: Effort normal and  breath sounds normal. No respiratory distress. She has no wheezes. She has no rales.  Abdominal: Soft. Bowel sounds are normal. She exhibits no distension. There is tenderness in the suprapubic area. There is no rebound.  Mild suprapubic tenderness. No peritoneal irritation. No palpable uterus.  Musculoskeletal: Normal range of motion.  Neurological: She is alert and oriented to person, place, and time.  Skin: Skin is warm and dry. No rash noted.  Psychiatric: She has a normal mood and affect. Her behavior is normal.    ED Course   Procedures (including critical care time) Labs Review Labs Reviewed  WET PREP, GENITAL - Abnormal; Notable for the following:    Trich, Wet Prep MANY (*)    Clue Cells Wet Prep HPF POC MANY (*)    WBC, Wet Prep HPF POC FEW (*)    All other components within normal limits  URINALYSIS, ROUTINE W REFLEX MICROSCOPIC - Abnormal; Notable for the following:    APPearance CLOUDY (*)    Leukocytes, UA SMALL (*)    All other components within normal limits  URINE MICROSCOPIC-ADD ON - Abnormal; Notable for the following:    Bacteria, UA FEW (*)    All other components within normal limits  HCG, QUANTITATIVE, PREGNANCY - Abnormal; Notable for the following:    hCG, Beta Chain, Quant, S 786 (*)    All other components within normal limits  POCT PREGNANCY, URINE - Abnormal; Notable for the following:    Preg Test, Ur POSITIVE (*)    All other components within normal limits  GC/CHLAMYDIA PROBE AMP  URINE CULTURE  CBC WITH DIFFERENTIAL  ABO/RH   Imaging Review US Pelvis Complete  05/10/2013   *RADIOLOGY REPORT*  Clinical Data: Abdominal pain.  Recent therapeutic abortion. Question retained products of conception.  TRANSABDOMINAL AND TRANSVAGINAL ULTRASOUND OF PELVIS Technique:  Both transabdominal and transvaginal ultrasound examinations of the pelvis were performed. Transabdominal technique was performed for global imaging of the pelvis including uterus, ovaries, adnexal regions, and pelvic cul-de-sac.  It was necessary to proceed with endovaginal exam following the transabdominal exam to visualize the uterus and ovaries.  Comparison:  03/23/2013 and 05/29/2009  Findings:  Uterus: The uterus measures 8.9 x 4.7 x 2.6 cm. Prominent venous lakes are noted.  Endometrium: The endometrium is thickened, measuring up to 2.1 cm. Vascularity of the endometrium is demonstrated, concerning for retained products of conception.  Right ovary:  The right ovary measures 2.9 x 1.9 x 1.9 cm.  A 1.8 cm cyst is  identified.  Left ovary: The left ovary measures 2.9 x 1.1 x 1.7 cm.  A left ovarian cyst, similar in appearance to the right ovarian cyst is identified, measuring up to 1.4 cm.  Other findings: Trace free fluid. Prominent pelvic veins are noted.  IMPRESSION: Thickened endometrium with internal vascularity, concerning for retained products of conception.  Results discussed with Dr. Rolland Porter at 11:20 a.m. on 05/10/2013.   Original Report Authenticated By: Jerene Dilling, M.D.    MDM   1. Retained products of conception following abortion    Procedure radiologist describing ultrasound is "very suggestive of retained products of conception.". Her hemoglobin is pending. She is B+. Her quantitative hCG is 780. I placed a call to GYN on call for [facility], or the patient is followed. Discussed the case with Dr. Su Hilt. Su Hilt, call immediately and a repair input on this case. She recommended the patient be transferred to admitting at [facility] hospital and that she be kept n.p.o.  for planned D&C. I discussed this in its entirety with the patient she has no additional questions plan transfer this time she remains quite stable.    Claudean Kinds, MD 05/10/13 1310

## 2013-05-10 NOTE — Op Note (Signed)
Preop Diagnosis: Retained POCs  Postop Diagnosis: Retained POCs  Procedure: Suction D&C   Anesthesia: Monitor Anesthesia Care   Attending: Purcell Nails, MD   Assistant: N/a  Findings: Minimal Tissue  Pathology: POCs  Fluids: 1500 cc  UOP: QS via straight cath prior to procedure  EBL: Minimal  Complications: None  Procedure: The patient was taken to the operating room after the risks benefits and alternatives were discussed with the patient, the patient verbalized understanding and consent signed and witnessed.  The patient was placed under MAC anesthesia, prepped and draped in the normal sterile fashion and a time out was performed.  A bivalve speculum was placed in the patient's vagina and the anterior lip of the cervix grasped with a single-tooth tenaculum. A paracervical block was administered using a total of 10 cc of 2% lidocaine.  The uterus was sounded to 9 cm and a size 8 suction curette was used. Suction curettage was performed until minimal tissue returned. Sharp curettage was performed until a gritty texture was noted. Suction curettage was performed once again to remove any remaining debris. All instruments were removed. The count was correct. The patient was transferred to the recovery room in good condition.

## 2013-05-10 NOTE — ED Notes (Signed)
[facility] called. 

## 2013-05-10 NOTE — ED Notes (Signed)
Patient transported to Ultrasound 

## 2013-05-10 NOTE — ED Notes (Signed)
Report called  

## 2013-05-10 NOTE — ED Notes (Signed)
Patient ambulated to restroom unassisted and without difficulty to provide [place] a urine specimen

## 2013-05-10 NOTE — MAU Provider Note (Addendum)
  History     CSN: [id number]  Arrival date and time: 05/10/13 7829   None     Chief Complaint  Patient presents with  . Abdominal Pain   HPI  [id number] with LMP 02/26/13 who initially found out she was pregnant in mid July when she presented to [facility] ER for toothache.  Pt could not stay for blood work because of childcare issues.  She subsequently went for medical ab on 04/13/13 and took the medicine on 04/14/13.  She had bleeding, cramping and noticeably passed the sac within that week.  She was fine until this week when she had increased cramping and spotting that worsened today and woke her up from sleep.  She presented to [facility] ER and u/s showed retained POCs.  She also has BV and trich on the wet prep.  OB History   Grav Para Term Preterm Abortions TAB SAB Ect Mult Living   3 1   1 1    1       Past Medical History  Diagnosis Date  . History of chicken pox   . Yeast infection   . H/O bipolar disorder   . Ovarian cyst     Past Surgical History  Procedure Laterality Date  . Wisdom tooth extraction    . Induced abortion  06/05/2012    elective    Family History  Problem Relation Age of Onset  . Hypertension Mother   . Cancer Mother   . Hypertension Maternal Grandmother   . Diabetes Maternal Grandmother   . Kidney failure Maternal Grandmother   . Stroke Maternal Grandmother   . Diabetes Paternal Grandmother   . Diabetes Father     History  Substance Use Topics  . Smoking status: Never Smoker   . Smokeless tobacco: Never Used  . Alcohol Use: 0.5 oz/week    1 drink(s) per week    Allergies:  Allergies  Allergen Reactions  . Shellfish Allergy     Prescriptions prior to admission  Medication Sig Dispense Refill  . acetaminophen (TYLENOL) 500 MG tablet Take 500 mg by mouth every 6 (six) hours as needed for pain.         ROS  Denies fever or chills.  She had nausea and vomiting this week.      Physical Exam   Blood pressure 102/56, pulse 53, temperature 97.9 F  (36.6 C), temperature source Oral, resp. rate 18, height 5\' 7"  (1.702 m), weight 58.968 kg (130 lb), last menstrual period 01/22/2013, SpO2 98.00%.  Physical Exam  Lungs CTA CV RRR Abd soft, midline pelvic tenderness Ext no calf tenderness VE closed and +tenderness  MAU Course  Procedures  U/s with retained POCs, thickened endometrium and 2 small bilateral ovarian cysts each less than 2cm CBC - wnl and Blood type B+ Wet prep - + trich and + BV GC/CT pending Declined remainder of STD w/u and said she was recently tested for HIV and it was negative Quant 700+  Assessment and Plan  P1 at 10 3/7 wks with retained POCs, trich and BV.  Will treat with doxy and flagy for a week.  I discussed options and pt wants to proceed with Suction D&C and antibiotics.  I explained the risks of bleeding, infection and injury with possibility of developing PID.  Instructed pt to have partner treated and use condoms.  Pt is agreeable and wants to proceed.  Purcell Nails 05/10/2013, 2:29 PM

## 2013-05-10 NOTE — Anesthesia Preprocedure Evaluation (Signed)
Anesthesia Evaluation  Patient identified by MRN, date of birth, ID band Patient awake    Reviewed: Allergy & Precautions, H&P , Patient's Chart, lab work & pertinent test results, reviewed documented beta blocker date and time   Airway Mallampati: II TM Distance: >3 FB Neck ROM: full    Dental no notable dental hx.    Pulmonary  breath sounds clear to auscultation  Pulmonary exam normal       Cardiovascular Rhythm:regular Rate:Normal     Neuro/Psych    GI/Hepatic   Endo/Other    Renal/GU      Musculoskeletal   Abdominal   Peds  Hematology   Anesthesia Other Findings   Reproductive/Obstetrics                           Anesthesia Physical Anesthesia Plan  ASA: II  Anesthesia Plan: MAC and General   Post-op Pain Management:    Induction: Intravenous  Airway Management Planned: LMA, Mask and Simple Face Mask  Additional Equipment:   Intra-op Plan:   Post-operative Plan:   Informed Consent: I have reviewed the patients History and Physical, chart, labs and discussed the procedure including the risks, benefits and alternatives for the proposed anesthesia with the patient or authorized representative who has indicated his/her understanding and acceptance.   Dental Advisory Given and Dental advisory given  Plan Discussed with: CRNA and Surgeon  Anesthesia Plan Comments:         Anesthesia Quick Evaluation

## 2013-05-10 NOTE — Anesthesia Postprocedure Evaluation (Signed)
  Anesthesia Post-op Note  Patient: Jaime Leonard  Procedure(s) Performed: Procedure(s): DILATATION AND EVACUATION (N/A) Patient is awake and responsive. Pain and nausea are reasonably well controlled. Vital signs are stable and clinically acceptable. Oxygen saturation is clinically acceptable. There are no apparent anesthetic complications at this time. Patient is ready for discharge.
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
## 2013-05-10 NOTE — ED Notes (Signed)
Per EMS-pt c/o of lower abd, pelvic pain. Pain comes and goes 10/10. States she had an induced abortion 1 month ago. Nausea no vomiting.

## 2013-05-10 NOTE — MAU Note (Signed)
Brought by EMS to MAU;

## 2013-05-11 ENCOUNTER — Encounter (HOSPITAL_COMMUNITY): Payer: Self-pay | Admitting: Obstetrics and Gynecology

## 2013-05-11 LAB — URINE CULTURE

## 2013-05-11 LAB — GC/CHLAMYDIA PROBE AMP
CT Probe RNA: NEGATIVE
GC Probe RNA: NEGATIVE

## 2013-09-02 NOTE — L&D Delivery Note (Signed)
  Vaginal Delivery Note  The pt utilized an epidural as pain management.   Spontaneous rupture of membranes on 05/04/14 at 1130, clear.  GBS was positive, PCN x 5 doses were given.  Cervical dilation was complete at  0920.     Pushing with guidance began at  1115.   NICHD Category 2 - FHR 150, moderate variability, + accels, variable decel with a prolong decel immediately prior to delivery, Dr Sallye Ober called to room stat.  After 44 minutes of pushing the head, shoulders and the body of a viable female infant Jaime Leonard) delivered spontaneously with maternal effort in the LOA position at 1159.  Dr Sallye Ober at the bedside for delivery and the immediate postpartum.  With vigorous tone and spontaneous cry, the infant was placed on moms abd.   The cord was clamped then cut by the patient herself.   Spontaneous delivery of a intact placenta with a 3 vessel cord via Shultz at 1205.  Episiotomy: None The vulva, perineum, vaginal vault, rectum and cervix were inspected, no repairs needed.   Postpartum pitocin as ordered.   Fundus firm, lochia moderate, bleeding under control.  EBL 100, Pt hemodynamically stable.  Sponge, laps and needle count correct and verified with the primary care nurse.    Mom and baby were left in stable condition, baby skin to skin.  Routine postpartum orders  Mother desires Depo provera for contraception   Placenta to pathology: NO Cord blood sent to lab: YES Cord Gases sent to lab: NO   APGARS:  9 at 1 minute and 9 at 5 minutes Weight:. 7lbs 15.5oz (0981X)  Attending MD available at all times.     Jaime Leonard, CNM, MSN 05/05/2014. 1:13 PM

## 2013-10-25 LAB — OB RESULTS CONSOLE HIV ANTIBODY (ROUTINE TESTING): HIV: NONREACTIVE

## 2013-10-25 LAB — OB RESULTS CONSOLE GC/CHLAMYDIA
CHLAMYDIA, DNA PROBE: NEGATIVE
Gonorrhea: NEGATIVE

## 2013-10-25 LAB — OB RESULTS CONSOLE RPR: RPR: NONREACTIVE

## 2013-10-25 LAB — OB RESULTS CONSOLE RUBELLA ANTIBODY, IGM: Rubella: IMMUNE

## 2013-10-25 LAB — OB RESULTS CONSOLE HEPATITIS B SURFACE ANTIGEN: Hepatitis B Surface Ag: NEGATIVE

## 2013-10-25 LAB — OB RESULTS CONSOLE ANTIBODY SCREEN: Antibody Screen: NEGATIVE

## 2013-10-25 LAB — OB RESULTS CONSOLE ABO/RH: RH TYPE: POSITIVE

## 2013-12-15 ENCOUNTER — Encounter (HOSPITAL_COMMUNITY): Payer: Self-pay | Admitting: *Deleted

## 2013-12-15 ENCOUNTER — Inpatient Hospital Stay (HOSPITAL_COMMUNITY)
Admission: AD | Admit: 2013-12-15 | Discharge: 2013-12-15 | Disposition: A | Discharge status: Home / Self Care | Payer: Medicaid Other | Source: Ambulatory Visit | Attending: Obstetrics and Gynecology | Admitting: Obstetrics and Gynecology

## 2013-12-15 DIAGNOSIS — N949 Unspecified condition associated with female genital organs and menstrual cycle: Secondary | ICD-10-CM | POA: Insufficient documentation

## 2013-12-15 DIAGNOSIS — R109 Unspecified abdominal pain: Secondary | ICD-10-CM | POA: Insufficient documentation

## 2013-12-15 DIAGNOSIS — O99891 Other specified diseases and conditions complicating pregnancy: Secondary | ICD-10-CM | POA: Insufficient documentation

## 2013-12-15 DIAGNOSIS — O9989 Other specified diseases and conditions complicating pregnancy, childbirth and the puerperium: Principal | ICD-10-CM

## 2013-12-15 LAB — URINALYSIS, ROUTINE W REFLEX MICROSCOPIC
BILIRUBIN URINE: NEGATIVE
Glucose, UA: NEGATIVE mg/dL
HGB URINE DIPSTICK: NEGATIVE
KETONES UR: NEGATIVE mg/dL
Leukocytes, UA: NEGATIVE
NITRITE: NEGATIVE
Protein, ur: NEGATIVE mg/dL
Specific Gravity, Urine: 1.015 (ref 1.005–1.030)
UROBILINOGEN UA: 0.2 mg/dL (ref 0.0–1.0)
pH: 8 (ref 5.0–8.0)

## 2013-12-15 NOTE — MAU Note (Signed)
Abnormal pain in lower abd pelvis.  Esp at night.  Has not been able to get comfortable or sleep.  Noted spotting 2 days ago.

## 2013-12-15 NOTE — MAU Provider Note (Signed)
History   23y.o. G4P0011 at 20.2wks presents, unannounced, complaining of abdominal pain and spotting.  Patient reports lower, bilateral, abdominal pain x 3 days that radiates to her back and causes problems with sleep and ambulation.  Patient denies attempting OTC treatment with tylenol and reports adequate hydration.  Patient also reports light pink spotting 2 days ago, while wiping, but none since the incident.  Patient reports active fetus, denies contractions, LoF, and current VB/spotting.  Patient reports recent completion of treatment for BV and UTI.  Patient Active Problem List   Diagnosis Date Noted  . Pregnancy test positive 05/26/2012    Chief Complaint  Patient presents with  . Abdominal Pain   HPI  OB History   Grav Para Term Preterm Abortions TAB SAB Ect Mult Living   4 1   1 1    1       Past Medical History  Diagnosis Date  . History of chicken pox   . Yeast infection   . Ovarian cyst     Past Surgical History  Procedure Laterality Date  . Wisdom tooth extraction    . Induced abortion  06/05/2012    elective  . Dilation and evacuation N/A 05/10/2013    Procedure: DILATATION AND EVACUATION;  Surgeon: Purcell NailsAngela Y Roberts, MD;  Location: [facility] OR[facility];  Service: Gynecology;  Laterality: N/A;    Family History  Problem Relation Age of Onset  . Hypertension Mother   . Cancer Mother   . Hypertension Maternal Grandmother   . Diabetes Maternal Grandmother   . Kidney failure Maternal Grandmother   . Stroke Maternal Grandmother   . Diabetes Paternal Grandmother   . Diabetes Father     History  Substance Use Topics  . Smoking status: Never Smoker   . Smokeless tobacco: Never Used  . Alcohol Use: 0.5 oz/week    1 drink(s) per week    Allergies:  Allergies  Allergen Reactions  . Shellfish Allergy     Prescriptions prior to admission  Medication Sig Dispense Refill  . metroNIDAZOLE (FLAGYL) 500 MG tablet Take 500 mg by mouth 2 (two) times daily. 12-16-13 patient  will take last does of Flagyl      . Prenatal Vit-Fe Fumarate-FA (PRENATAL MULTIVITAMIN) TABS tablet Take 1 tablet by mouth daily at 12 noon.        ROS  See HPI Above  Physical Exam   Blood pressure 131/63, pulse 80, temperature 98.4 F (36.9 C), temperature source Oral, resp. rate 18, height 5\' 7"  (1.702 m), weight 125 lb (56.7 kg), unknown if currently breastfeeding.   Physical Exam Abdomen: Soft, Gravid, and NT at fundus--pain elicited above pelvis upon deep palpation Patient denies sexual intercourse and declines STD check. Patient very tearful with bimanual exam SVE: Closed/Thick/High Extremeties: WNL Back: No CVAT ED Course  IUP at 20.2wks Back Pain Round Ligament Pain  UA-WNL Discussed common pregnancy discomforts including back and round ligament pain Given comfort measures techniques Rx for flexeril to be sent Call if you have any questions or concerns prior to your next visit.    Marlene BastJessica Lynn Classie Weng CNM, MSN 12/15/2013 12:44 PM

## 2013-12-15 NOTE — Discharge Instructions (Signed)

## 2014-01-19 ENCOUNTER — Encounter (HOSPITAL_COMMUNITY): Payer: Self-pay | Admitting: Obstetrics and Gynecology

## 2014-01-19 ENCOUNTER — Inpatient Hospital Stay (HOSPITAL_COMMUNITY)
Admission: AD | Admit: 2014-01-19 | Discharge: 2014-01-20 | Disposition: A | Discharge status: Home / Self Care | Payer: Medicaid Other | Source: Ambulatory Visit | Attending: Obstetrics and Gynecology | Admitting: Obstetrics and Gynecology

## 2014-01-19 DIAGNOSIS — IMO0002 Reserved for concepts with insufficient information to code with codable children: Secondary | ICD-10-CM | POA: Diagnosis present

## 2014-01-19 DIAGNOSIS — O479 False labor, unspecified: Secondary | ICD-10-CM | POA: Diagnosis present

## 2014-01-19 DIAGNOSIS — O4700 False labor before 37 completed weeks of gestation, unspecified trimester: Secondary | ICD-10-CM | POA: Diagnosis present

## 2014-01-19 DIAGNOSIS — O368190 Decreased fetal movements, unspecified trimester, not applicable or unspecified: Secondary | ICD-10-CM | POA: Insufficient documentation

## 2014-01-19 LAB — URINALYSIS, ROUTINE W REFLEX MICROSCOPIC
BILIRUBIN URINE: NEGATIVE
GLUCOSE, UA: NEGATIVE mg/dL
Ketones, ur: 15 mg/dL — AB
Leukocytes, UA: NEGATIVE
Nitrite: NEGATIVE
PROTEIN: 30 mg/dL — AB
SPECIFIC GRAVITY, URINE: 1.02 (ref 1.005–1.030)
UROBILINOGEN UA: 0.2 mg/dL (ref 0.0–1.0)
pH: 6.5 (ref 5.0–8.0)

## 2014-01-19 LAB — WET PREP, GENITAL
Clue Cells Wet Prep HPF POC: NONE SEEN
TRICH WET PREP: NONE SEEN
Yeast Wet Prep HPF POC: NONE SEEN

## 2014-01-19 LAB — URINE MICROSCOPIC-ADD ON

## 2014-01-19 LAB — FETAL FIBRONECTIN: FETAL FIBRONECTIN: NEGATIVE

## 2014-01-19 MED ORDER — NALBUPHINE HCL 10 MG/ML IJ SOLN
10.0000 mg | INTRAMUSCULAR | Status: DC | PRN
  Administered 2014-01-19: 10 mg via INTRAMUSCULAR
  Filled 2014-01-19: qty 1

## 2014-01-19 MED ORDER — NIFEDIPINE 10 MG PO CAPS
10.0000 mg | ORAL_CAPSULE | ORAL | Status: DC | PRN
  Administered 2014-01-19 – 2014-01-20 (×2): 10 mg via ORAL
  Filled 2014-01-19 (×2): qty 1

## 2014-01-19 NOTE — MAU Note (Signed)
Pt presents complaining of abdominal pain that started at 2100 with some spotting. Denies leaking of fluid.
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
## 2014-01-20 ENCOUNTER — Encounter (HOSPITAL_COMMUNITY): Payer: Self-pay

## 2014-01-20 DIAGNOSIS — O4700 False labor before 37 completed weeks of gestation, unspecified trimester: Secondary | ICD-10-CM | POA: Diagnosis present

## 2014-01-20 DIAGNOSIS — IMO0002 Reserved for concepts with insufficient information to code with codable children: Secondary | ICD-10-CM | POA: Diagnosis present

## 2014-01-20 DIAGNOSIS — O479 False labor, unspecified: Secondary | ICD-10-CM | POA: Diagnosis present

## 2014-01-20 LAB — GC/CHLAMYDIA PROBE AMP
CT PROBE, AMP APTIMA: NEGATIVE
GC Probe RNA: NEGATIVE

## 2014-01-20 MED ORDER — NIFEDIPINE 10 MG PO CAPS: 10.0000 mg | ORAL_CAPSULE | ORAL | Status: DC | PRN

## 2014-01-20 NOTE — Discharge Instructions (Signed)
Preterm Labor Information Preterm labor is when labor starts at less than 37 weeks of pregnancy. The normal length of a pregnancy is 39 to 41 weeks. CAUSES Often, there is no identifiable underlying cause as to why a woman goes into preterm labor. One of the most common known causes of preterm labor is infection. Infections of the uterus, cervix, vagina, amniotic sac, bladder, kidney, or even the lungs (pneumonia) can cause labor to start. Other suspected causes of preterm labor include:   Urogenital infections, such as yeast infections and bacterial vaginosis.   Uterine abnormalities (uterine shape, uterine septum, fibroids, or bleeding from the placenta).   A cervix that has been operated on (it may fail to stay closed).   Malformations in the fetus.   Multiple gestations (twins, triplets, and so on).   Breakage of the amniotic sac.  RISK FACTORS  Having a previous history of preterm labor.   Having premature rupture of membranes (PROM).   Having a placenta that covers the opening of the cervix (placenta previa).   Having a placenta that separates from the uterus (placental abruption).   Having a cervix that is too weak to hold the fetus in the uterus (incompetent cervix).   Having too much fluid in the amniotic sac (polyhydramnios).   Taking illegal drugs or smoking while pregnant.   Not gaining enough weight while pregnant.   Being younger than 18 and older than 23 years old.   Having a low socioeconomic status.   Being African American. SYMPTOMS Signs and symptoms of preterm labor include:   Menstrual-like cramps, abdominal pain, or back pain.  Uterine contractions that are regular, as frequent as six in an hour, regardless of their intensity (may be mild or painful).  Contractions that start on the top of the uterus and spread down to the lower abdomen and back.   A sense of increased pelvic pressure.   A watery or bloody mucus discharge that  comes from the vagina.  TREATMENT Depending on the length of the pregnancy and other circumstances, your health care provider may suggest bed rest. If necessary, there are medicines that can be given to stop contractions and to mature the fetal lungs. If labor happens before 34 weeks of pregnancy, a prolonged hospital stay may be recommended. Treatment depends on the condition of both you and the fetus.  WHAT SHOULD YOU DO IF YOU THINK YOU ARE IN PRETERM LABOR? Call your health care provider right away. You will need to go to the hospital to get checked immediately. HOW CAN YOU PREVENT PRETERM LABOR IN FUTURE PREGNANCIES? You should:   Stop smoking if you smoke.  Maintain healthy weight gain and avoid chemicals and drugs that are not necessary.  Be watchful for any type of infection.  Inform your health care provider if you have a known history of preterm labor. Document Released: 11/09/2003 Document Revised: 04/21/2013 Document Reviewed: 09/21/2012 Exit[employer] Patient Information 2014 [employer].    

## 2014-01-20 NOTE — Progress Notes (Signed)
Pt no longer having contractions.Pt states her abdomen is very sore

## 2014-04-01 LAB — OB RESULTS CONSOLE GBS: STREP GROUP B AG: POSITIVE

## 2014-04-29 ENCOUNTER — Encounter (HOSPITAL_COMMUNITY): Payer: Self-pay | Admitting: *Deleted

## 2014-04-29 ENCOUNTER — Inpatient Hospital Stay (HOSPITAL_COMMUNITY): Payer: Medicaid Other

## 2014-04-29 ENCOUNTER — Inpatient Hospital Stay (HOSPITAL_COMMUNITY)
Admit: 2014-04-29 | Discharge: 2014-04-29 | Disposition: A | Discharge status: Home / Self Care | Payer: Medicaid Other | Source: Ambulatory Visit | Attending: Obstetrics and Gynecology | Admitting: Obstetrics and Gynecology

## 2014-04-29 DIAGNOSIS — O479 False labor, unspecified: Secondary | ICD-10-CM | POA: Diagnosis not present

## 2014-04-29 DIAGNOSIS — O4700 False labor before 37 completed weeks of gestation, unspecified trimester: Secondary | ICD-10-CM

## 2014-04-29 DIAGNOSIS — O368190 Decreased fetal movements, unspecified trimester, not applicable or unspecified: Secondary | ICD-10-CM

## 2014-04-29 LAB — AMNISURE RUPTURE OF MEMBRANE (ROM) NOT AT ARMC: AMNISURE: NEGATIVE
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
## 2014-04-29 NOTE — MAU Note (Signed)
Lost mucus plug yesterday, felt some tickles last night, none today.  Contractions started at 1330 but at 2 felt like needed evalutation

## 2014-04-29 NOTE — Discharge Instructions (Signed)
Braxton Hicks Contractions Contractions of the uterus can occur throughout pregnancy. Contractions are not always a sign that you are in labor.  WHAT ARE BRAXTON HICKS CONTRACTIONS?  Contractions that occur before labor are called Braxton Hicks contractions, or false labor. Toward the end of pregnancy (32-34 weeks), these contractions can develop more often and may become more forceful. This is not true labor because these contractions do not result in opening (dilatation) and thinning of the cervix. They are sometimes difficult to tell apart from true labor because these contractions can be forceful and people have different pain tolerances. You should not feel embarrassed if you go to the hospital with false labor. Sometimes, the only way to tell if you are in true labor is for your health care provider to look for changes in the cervix. If there are no prenatal problems or other health problems associated with the pregnancy, it is completely safe to be sent home with false labor and await the onset of true labor. HOW CAN YOU TELL THE DIFFERENCE BETWEEN TRUE AND FALSE LABOR? False Labor  The contractions of false labor are usually shorter and not as hard as those of true labor.   The contractions are usually irregular.   The contractions are often felt in the front of the lower abdomen and in the groin.   The contractions may go away when you walk around or change positions while lying down.   The contractions get weaker and are shorter lasting as time goes on.   The contractions do not usually become progressively stronger, regular, and closer together as with true labor.  True Labor  Contractions in true labor last 30-70 seconds, become very regular, usually become more intense, and increase in frequency.   The contractions do not go away with walking.   The discomfort is usually felt in the top of the uterus and spreads to the lower abdomen and low back.   True labor can be  determined by your health care provider with an exam. This will show that the cervix is dilating and getting thinner.  WHAT TO REMEMBER  Keep up with your usual exercises and follow other instructions given by your health care provider.   Take medicines as directed by your health care provider.   Keep your regular prenatal appointments.   Eat and drink lightly if you think you are going into labor.   If Braxton Hicks contractions are making you uncomfortable:   Change your position from lying down or resting to walking, or from walking to resting.   Sit and rest in a tub of warm water.   Drink 2-3 glasses of water. Dehydration may cause these contractions.   Do slow and deep breathing several times an hour.  WHEN SHOULD I SEEK IMMEDIATE MEDICAL CARE? Seek immediate medical care if:  Your contractions become stronger, more regular, and closer together.   You have fluid leaking or gushing from your vagina.   You have a fever.   You pass blood-tinged mucus.   You have vaginal bleeding.   You have continuous abdominal pain.   You have low back pain that you never had before.   You feel your baby's head pushing down and causing pelvic pressure.   Your baby is not moving as much as it used to.  Document Released: 08/19/2005 Document Revised: 08/24/2013 Document Reviewed: 05/31/2013 ExitCare Patient Information 2015 [employer]. This information is not intended to replace advice given to you by your health care   provider. Make sure you discuss any questions you have with your health care provider.  Fetal Movement Counts Patient Name: __________________________________________________ Patient Due Date: ____________________ Performing a fetal movement count is highly recommended in high-risk pregnancies, but it is good for every pregnant woman to do. Your health care provider may ask you to start counting fetal movements at 28 weeks of the pregnancy. Fetal  movements often increase:  After eating a full meal.  After physical activity.  After eating or drinking something sweet or cold.  At rest. Pay attention to when you feel the baby is most active. This will help you notice a pattern of your baby's sleep and wake cycles and what factors contribute to an increase in fetal movement. It is important to perform a fetal movement count at the same time each day when your baby is normally most active.  HOW TO COUNT FETAL MOVEMENTS 1. Find a quiet and comfortable area to sit or lie down on your left side. Lying on your left side provides the best blood and oxygen circulation to your baby. 2. Write down the day and time on a sheet of paper or in a journal. 3. Start counting kicks, flutters, swishes, rolls, or jabs in a 2-hour period. You should feel at least 10 movements within 2 hours. 4. If you do not feel 10 movements in 2 hours, wait 2-3 hours and count again. Look for a change in the pattern or not enough counts in 2 hours. SEEK MEDICAL CARE IF:  You feel less than 10 counts in 2 hours, tried twice.  There is no movement in over an hour.  The pattern is changing or taking longer each day to reach 10 counts in 2 hours.  You feel the baby is not moving as he or she usually does. Date: ____________ Movements: ____________ Start time: ____________ Finish time: ____________  Date: ____________ Movements: ____________ Start time: ____________ Finish time: ____________ Date: ____________ Movements: ____________ Start time: ____________ Finish time: ____________ Date: ____________ Movements: ____________ Start time: ____________ Finish time: ____________ Date: ____________ Movements: ____________ Start time: ____________ Finish time: ____________ Date: ____________ Movements: ____________ Start time: ____________ Finish time: ____________ Date: ____________ Movements: ____________ Start time: ____________ Finish time: ____________ Date: ____________  Movements: ____________ Start time: ____________ Finish time: ____________  Date: ____________ Movements: ____________ Start time: ____________ Finish time: ____________ Date: ____________ Movements: ____________ Start time: ____________ Finish time: ____________ Date: ____________ Movements: ____________ Start time: ____________ Finish time: ____________ Date: ____________ Movements: ____________ Start time: ____________ Finish time: ____________ Date: ____________ Movements: ____________ Start time: ____________ Finish time: ____________ Date: ____________ Movements: ____________ Start time: ____________ Finish time: ____________ Date: ____________ Movements: ____________ Start time: ____________ Finish time: ____________  Date: ____________ Movements: ____________ Start time: ____________ Finish time: ____________ Date: ____________ Movements: ____________ Start time: ____________ Finish time: ____________ Date: ____________ Movements: ____________ Start time: ____________ Finish time: ____________ Date: ____________ Movements: ____________ Start time: ____________ Finish time: ____________ Date: ____________ Movements: ____________ Start time: ____________ Finish time: ____________ Date: ____________ Movements: ____________ Start time: ____________ Finish time: ____________ Date: ____________ Movements: ____________ Start time: ____________ Finish time: ____________  Date: ____________ Movements: ____________ Start time: ____________ Finish time: ____________ Date: ____________ Movements: ____________ Start time: ____________ Finish time: ____________ Date: ____________ Movements: ____________ Start time: ____________ Finish time: ____________ Date: ____________ Movements: ____________ Start time: ____________ Finish time: ____________ Date: ____________ Movements: ____________ Start time: ____________ Finish time: ____________ Date: ____________ Movements: ____________ Start time:  ____________ Finish time: ____________ Date: ____________ Movements:   ____________ Start time: ____________ Finish time: ____________  Date: ____________ Movements: ____________ Start time: ____________ Finish time: ____________ Date: ____________ Movements: ____________ Start time: ____________ Finish time: ____________ Date: ____________ Movements: ____________ Start time: ____________ Finish time: ____________ Date: ____________ Movements: ____________ Start time: ____________ Finish time: ____________ Date: ____________ Movements: ____________ Start time: ____________ Finish time: ____________ Date: ____________ Movements: ____________ Start time: ____________ Finish time: ____________ Date: ____________ Movements: ____________ Start time: ____________ Finish time: ____________  Date: ____________ Movements: ____________ Start time: ____________ Finish time: ____________ Date: ____________ Movements: ____________ Start time: ____________ Finish time: ____________ Date: ____________ Movements: ____________ Start time: ____________ Finish time: ____________ Date: ____________ Movements: ____________ Start time: ____________ Finish time: ____________ Date: ____________ Movements: ____________ Start time: ____________ Finish time: ____________ Date: ____________ Movements: ____________ Start time: ____________ Finish time: ____________ Date: ____________ Movements: ____________ Start time: ____________ Finish time: ____________  Date: ____________ Movements: ____________ Start time: ____________ Finish time: ____________ Date: ____________ Movements: ____________ Start time: ____________ Finish time: ____________ Date: ____________ Movements: ____________ Start time: ____________ Finish time: ____________ Date: ____________ Movements: ____________ Start time: ____________ Finish time: ____________ Date: ____________ Movements: ____________ Start time: ____________ Finish time: ____________ Date:  ____________ Movements: ____________ Start time: ____________ Finish time: ____________ Date: ____________ Movements: ____________ Start time: ____________ Finish time: ____________  Date: ____________ Movements: ____________ Start time: ____________ Finish time: ____________ Date: ____________ Movements: ____________ Start time: ____________ Finish time: ____________ Date: ____________ Movements: ____________ Start time: ____________ Finish time: ____________ Date: ____________ Movements: ____________ Start time: ____________ Finish time: ____________ Date: ____________ Movements: ____________ Start time: ____________ Finish time: ____________ Date: ____________ Movements: ____________ Start time: ____________ Finish time: ____________ Document Released: 09/18/2006 Document Revised: 01/03/2014 Document Reviewed: 06/15/2012 ExitCare Patient Information 2015 [employer]. This information is not intended to replace advice given to you by your health care provider. Make sure you discuss any questions you have with your health care provider.  

## 2014-04-29 NOTE — MAU Provider Note (Signed)
Results for orders placed during the hospital encounter of 04/29/14 (from the past 24 hour(s))  AMNISURE RUPTURE OF MEMBRANE (ROM)     Status: None   Collection Time    04/29/14  3:45 PM      Result Value Ref Range   Amnisure ROM NEGATIVE     FHR category 1 CTX 5-8 minutes [place] - AFI 17.42, BPP 8/8, FHR 127 DC to home  Labor precaution and kick count instruction given FU in the office at next [facility] 05/06/14   Chastity Noland, CNM, MSN 04/29/2014. 4:29 PM

## 2014-05-03 ENCOUNTER — Encounter (HOSPITAL_COMMUNITY): Payer: Self-pay | Admitting: *Deleted

## 2014-05-03 ENCOUNTER — Telehealth (HOSPITAL_COMMUNITY): Payer: Self-pay | Admitting: *Deleted

## 2014-05-03 NOTE — Telephone Encounter (Signed)
Preadmission screen  

## 2014-05-04 ENCOUNTER — Encounter (HOSPITAL_COMMUNITY): Payer: Medicaid Other | Admitting: Anesthesiology

## 2014-05-04 ENCOUNTER — Encounter (HOSPITAL_COMMUNITY): Payer: Self-pay | Admitting: *Deleted

## 2014-05-04 ENCOUNTER — Inpatient Hospital Stay (HOSPITAL_COMMUNITY): Payer: Medicaid Other | Admitting: Anesthesiology

## 2014-05-04 ENCOUNTER — Inpatient Hospital Stay (HOSPITAL_COMMUNITY)
Admission: AD | Admit: 2014-05-04 | Discharge: 2014-05-07 | DRG: 775 | Disposition: A | Discharge status: Home / Self Care | Payer: Medicaid Other | Source: Ambulatory Visit | Attending: Obstetrics & Gynecology | Admitting: Obstetrics & Gynecology

## 2014-05-04 DIAGNOSIS — E559 Vitamin D deficiency, unspecified: Secondary | ICD-10-CM | POA: Diagnosis present

## 2014-05-04 DIAGNOSIS — O99344 Other mental disorders complicating childbirth: Secondary | ICD-10-CM | POA: Diagnosis present

## 2014-05-04 DIAGNOSIS — O9989 Other specified diseases and conditions complicating pregnancy, childbirth and the puerperium: Secondary | ICD-10-CM

## 2014-05-04 DIAGNOSIS — Z833 Family history of diabetes mellitus: Secondary | ICD-10-CM

## 2014-05-04 DIAGNOSIS — O99892 Other specified diseases and conditions complicating childbirth: Secondary | ICD-10-CM | POA: Diagnosis present

## 2014-05-04 DIAGNOSIS — B951 Streptococcus, group B, as the cause of diseases classified elsewhere: Secondary | ICD-10-CM | POA: Diagnosis present

## 2014-05-04 DIAGNOSIS — O09299 Supervision of pregnancy with other poor reproductive or obstetric history, unspecified trimester: Secondary | ICD-10-CM

## 2014-05-04 DIAGNOSIS — Z823 Family history of stroke: Secondary | ICD-10-CM

## 2014-05-04 DIAGNOSIS — Z91013 Allergy to seafood: Secondary | ICD-10-CM | POA: Diagnosis present

## 2014-05-04 DIAGNOSIS — O4290 Premature rupture of membranes, unspecified as to length of time between rupture and onset of labor, unspecified weeks of gestation: Principal | ICD-10-CM | POA: Diagnosis present

## 2014-05-04 DIAGNOSIS — Z22330 Carrier of Group B streptococcus: Secondary | ICD-10-CM | POA: Diagnosis not present

## 2014-05-04 DIAGNOSIS — F419 Anxiety disorder, unspecified: Secondary | ICD-10-CM

## 2014-05-04 DIAGNOSIS — F341 Dysthymic disorder: Secondary | ICD-10-CM | POA: Diagnosis present

## 2014-05-04 DIAGNOSIS — F329 Major depressive disorder, single episode, unspecified: Secondary | ICD-10-CM | POA: Diagnosis present

## 2014-05-04 DIAGNOSIS — F319 Bipolar disorder, unspecified: Secondary | ICD-10-CM | POA: Diagnosis not present

## 2014-05-04 LAB — CBC
HEMATOCRIT: 33.1 % — AB (ref 36.0–46.0)
HEMOGLOBIN: 11.4 g/dL — AB (ref 12.0–15.0)
MCH: 28 pg (ref 26.0–34.0)
MCHC: 34.4 g/dL (ref 30.0–36.0)
MCV: 81.3 fL (ref 78.0–100.0)
Platelets: 304 10*3/uL (ref 150–400)
RBC: 4.07 MIL/uL (ref 3.87–5.11)
RDW: 12.7 % (ref 11.5–15.5)
WBC: 11.8 10*3/uL — AB (ref 4.0–10.5)

## 2014-05-04 MED ORDER — FENTANYL 2.5 MCG/ML BUPIVACAINE 1/10 % EPIDURAL INFUSION (WH - ANES)
14.0000 mL/h | INTRAMUSCULAR | Status: DC | PRN
  Administered 2014-05-04 – 2014-05-05 (×2): 14 mL/h via EPIDURAL
  Filled 2014-05-04 (×3): qty 125

## 2014-05-04 MED ORDER — LACTATED RINGERS IV SOLN
500.0000 mL | Freq: Once | INTRAVENOUS | Status: AC
  Administered 2014-05-04: 500 mL via INTRAVENOUS

## 2014-05-04 MED ORDER — CITRIC ACID-SODIUM CITRATE 334-500 MG/5ML PO SOLN
30.0000 mL | ORAL | Status: DC | PRN
Start: 2014-05-04 — End: 2014-05-05
  Filled 2014-05-04: qty 15

## 2014-05-04 MED ORDER — FLEET ENEMA 7-19 GM/118ML RE ENEM: 1.0000 | ENEMA | RECTAL | Status: DC | PRN

## 2014-05-04 MED ORDER — LIDOCAINE HCL (PF) 1 % IJ SOLN
INTRAMUSCULAR | Status: DC | PRN
  Administered 2014-05-04 (×2): 4 mL

## 2014-05-04 MED ORDER — PHENYLEPHRINE 40 MCG/ML (10ML) SYRINGE FOR IV PUSH (FOR BLOOD PRESSURE SUPPORT)
80.0000 ug | PREFILLED_SYRINGE | INTRAVENOUS | Status: DC | PRN
  Filled 2014-05-04: qty 2

## 2014-05-04 MED ORDER — BUTORPHANOL TARTRATE 1 MG/ML IJ SOLN: 1.0000 mg | INTRAMUSCULAR | Status: DC | PRN

## 2014-05-04 MED ORDER — ONDANSETRON HCL 4 MG/2ML IJ SOLN
4.0000 mg | Freq: Four times a day (QID) | INTRAMUSCULAR | Status: DC | PRN
Start: 2014-05-04 — End: 2014-05-05

## 2014-05-04 MED ORDER — TERBUTALINE SULFATE 1 MG/ML IJ SOLN: 0.2500 mg | Freq: Once | INTRAMUSCULAR | Status: AC | PRN

## 2014-05-04 MED ORDER — FENTANYL 2.5 MCG/ML BUPIVACAINE 1/10 % EPIDURAL INFUSION (WH - ANES)
14.0000 mL/h | INTRAMUSCULAR | Status: DC | PRN
  Administered 2014-05-05: 14 mL/h via EPIDURAL

## 2014-05-04 MED ORDER — EPHEDRINE 5 MG/ML INJ
10.0000 mg | INTRAVENOUS | Status: DC | PRN
  Filled 2014-05-04: qty 2

## 2014-05-04 MED ORDER — LIDOCAINE HCL (PF) 1 % IJ SOLN
30.0000 mL | INTRAMUSCULAR | Status: DC | PRN
  Filled 2014-05-04: qty 30

## 2014-05-04 MED ORDER — PHENYLEPHRINE 40 MCG/ML (10ML) SYRINGE FOR IV PUSH (FOR BLOOD PRESSURE SUPPORT)
80.0000 ug | PREFILLED_SYRINGE | INTRAVENOUS | Status: DC | PRN
  Filled 2014-05-04: qty 2
  Filled 2014-05-04: qty 10

## 2014-05-04 MED ORDER — OXYTOCIN 40 UNITS IN LACTATED RINGERS INFUSION - SIMPLE MED
62.5000 mL/h | INTRAVENOUS | Status: DC
  Filled 2014-05-04: qty 1000

## 2014-05-04 MED ORDER — LACTATED RINGERS IV SOLN
INTRAVENOUS | Status: DC
  Administered 2014-05-05: 03:00:00 via INTRAVENOUS

## 2014-05-04 MED ORDER — OXYCODONE-ACETAMINOPHEN 5-325 MG PO TABS: 2.0000 | ORAL_TABLET | ORAL | Status: DC | PRN

## 2014-05-04 MED ORDER — DIPHENHYDRAMINE HCL 50 MG/ML IJ SOLN: 12.5000 mg | INTRAMUSCULAR | Status: DC | PRN

## 2014-05-04 MED ORDER — OXYCODONE-ACETAMINOPHEN 5-325 MG PO TABS: 1.0000 | ORAL_TABLET | ORAL | Status: DC | PRN

## 2014-05-04 MED ORDER — ACETAMINOPHEN 325 MG PO TABS
650.0000 mg | ORAL_TABLET | ORAL | Status: DC | PRN
  Administered 2014-05-04: 650 mg via ORAL
  Filled 2014-05-04: qty 2

## 2014-05-04 MED ORDER — OXYTOCIN 40 UNITS IN LACTATED RINGERS INFUSION - SIMPLE MED
1.0000 m[IU]/min | INTRAVENOUS | Status: DC
  Administered 2014-05-04: 2 m[IU]/min via INTRAVENOUS

## 2014-05-04 MED ORDER — PENICILLIN G POTASSIUM 5000000 UNITS IJ SOLR
2.5000 10*6.[IU] | INTRAVENOUS | Status: DC
  Administered 2014-05-04 – 2014-05-05 (×3): 2.5 10*6.[IU] via INTRAVENOUS
  Filled 2014-05-04 (×8): qty 2.5

## 2014-05-04 MED ORDER — OXYTOCIN BOLUS FROM INFUSION
500.0000 mL | INTRAVENOUS | Status: DC
  Administered 2014-05-05: 500 mL via INTRAVENOUS

## 2014-05-04 MED ORDER — PENICILLIN G POTASSIUM 5000000 UNITS IJ SOLR
5.0000 10*6.[IU] | Freq: Once | INTRAVENOUS | Status: AC
  Administered 2014-05-04: 5 10*6.[IU] via INTRAVENOUS
  Filled 2014-05-04: qty 5

## 2014-05-04 MED ORDER — FENTANYL 2.5 MCG/ML BUPIVACAINE 1/10 % EPIDURAL INFUSION (WH - ANES)
INTRAMUSCULAR | Status: DC | PRN
  Administered 2014-05-04: 14 mL/h via EPIDURAL

## 2014-05-04 MED ORDER — LACTATED RINGERS IV SOLN: 500.0000 mL | INTRAVENOUS | Status: DC | PRN

## 2014-05-04 NOTE — Progress Notes (Addendum)
  Subjective: Meet and greet. Ctxs mild and not uncomfortable. Planning epidural. Leaking clear fluid since yesterday. Family at bedside.  Objective: BP 112/65  Pulse 82  Temp(Src) 97.2 F (36.2 C) (Axillary)  Resp 20  Ht  (1.702 m)  Wt 156 lb (70.761 kg)  BMI 24.43 kg/m2  SpO2 100%     FHT: Baseline FHR 135 w/ moderate variability, +accels, earlys, no lates or variables UC:   irregular, every 1-7 minutes, palpate mild SVE:   Dilation: 3 Exam by:: Alizae Bechtel CNM Cephalic by [place] Cervix very posterior, best guess for cervical exam = 3 cm  Assessment:  SROM (+fern and +pooling in office today) GBS positive Mild ctxs  Plan: Reviewed R&B of Pitocin augmentation w/ pt, including risk of C/S and need for further intervention. Pt seem to understand these risks and is agreeable w/ proceeding. Continue GBS prophylaxis.   Epidural as desired. Consult as indicated. Anticipate progress and SVD.  Sherre Scarlet CNM 05/04/2014, 8:37 PM
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
## 2014-05-04 NOTE — Anesthesia Preprocedure Evaluation (Signed)
Anesthesia Evaluation  Patient identified by MRN, date of birth, ID band Patient awake    Reviewed: Allergy & Precautions, H&P , NPO status , Patient's Chart, lab work & pertinent test results, reviewed documented beta blocker date and time   Airway Mallampati: II TM Distance: >3 FB Neck ROM: full    Dental no notable dental hx.    Pulmonary neg pulmonary ROS,  breath sounds clear to auscultation  Pulmonary exam normal       Cardiovascular negative cardio ROS  Rhythm:regular Rate:Normal     Neuro/Psych PSYCHIATRIC DISORDERS Bipolar Disorder negative neurological ROS     GI/Hepatic negative GI ROS, Neg liver ROS,   Endo/Other  negative endocrine ROS  Renal/GU negative Renal ROS     Musculoskeletal negative musculoskeletal ROS (+)   Abdominal   Peds  Hematology negative hematology ROS (+)   Anesthesia Other Findings   Reproductive/Obstetrics (+) Pregnancy                           Anesthesia Physical  Anesthesia Plan  ASA: II  Anesthesia Plan: Epidural   Post-op Pain Management:    Induction:   Airway Management Planned:   Additional Equipment:   Intra-op Plan:   Post-operative Plan:   Informed Consent: I have reviewed the patients History and Physical, chart, labs and discussed the procedure including the risks, benefits and alternatives for the proposed anesthesia with the patient or authorized representative who has indicated his/her understanding and acceptance.     Plan Discussed with:   Anesthesia Plan Comments:         Anesthesia Quick Evaluation

## 2014-05-04 NOTE — Anesthesia Procedure Notes (Signed)
Epidural Patient location during procedure: OB  Staffing Anesthesiologist: Gradie Ohm R Performed by: anesthesiologist   Preanesthetic Checklist Completed: patient identified, pre-op evaluation, timeout performed, IV checked, risks and benefits discussed and monitors and equipment checked  Epidural Patient position: sitting Prep: site prepped and draped and DuraPrep Patient monitoring: heart rate Approach: midline Injection technique: LOR air and LOR saline  Needle:  Needle type: Tuohy  Needle gauge: 17 G Needle length: 9 cm Needle insertion depth: 5 cm Catheter type: closed end flexible Catheter size: 19 Gauge Catheter at skin depth: 11 cm Test dose: negative  Assessment Sensory level: T8 Events: blood not aspirated, injection not painful, no injection resistance, negative IV test and no paresthesia  Additional Notes Reason for block:procedure for pain   

## 2014-05-05 ENCOUNTER — Encounter (HOSPITAL_COMMUNITY): Payer: Self-pay | Admitting: *Deleted

## 2014-05-05 LAB — RPR

## 2014-05-05 MED ORDER — LACTATED RINGERS IV SOLN
INTRAVENOUS | Status: DC
  Administered 2014-05-05: 300 mL via INTRAUTERINE
  Administered 2014-05-05: 06:00:00 via INTRAUTERINE

## 2014-05-05 MED ORDER — SIMETHICONE 80 MG PO CHEW: 80.0000 mg | CHEWABLE_TABLET | ORAL | Status: DC | PRN

## 2014-05-05 MED ORDER — HYDROCODONE-ACETAMINOPHEN 5-325 MG PO TABS
1.0000 | ORAL_TABLET | ORAL | Status: DC | PRN
  Administered 2014-05-05 – 2014-05-06 (×3): 1 via ORAL
  Filled 2014-05-05 (×3): qty 1

## 2014-05-05 MED ORDER — ONDANSETRON HCL 4 MG/2ML IJ SOLN: 4.0000 mg | INTRAMUSCULAR | Status: DC | PRN

## 2014-05-05 MED ORDER — OXYCODONE-ACETAMINOPHEN 5-325 MG PO TABS: 1.0000 | ORAL_TABLET | ORAL | Status: DC | PRN

## 2014-05-05 MED ORDER — LANOLIN HYDROUS EX OINT: TOPICAL_OINTMENT | CUTANEOUS | Status: DC | PRN

## 2014-05-05 MED ORDER — OXYCODONE-ACETAMINOPHEN 5-325 MG PO TABS: 2.0000 | ORAL_TABLET | ORAL | Status: DC | PRN

## 2014-05-05 MED ORDER — MEDROXYPROGESTERONE ACETATE 150 MG/ML IM SUSP: 150.0000 mg | Freq: Once | INTRAMUSCULAR | Status: DC

## 2014-05-05 MED ORDER — SENNOSIDES-DOCUSATE SODIUM 8.6-50 MG PO TABS
2.0000 | ORAL_TABLET | ORAL | Status: DC
  Administered 2014-05-05 – 2014-05-07 (×2): 2 via ORAL
  Filled 2014-05-05 (×2): qty 2

## 2014-05-05 MED ORDER — IBUPROFEN 600 MG PO TABS
600.0000 mg | ORAL_TABLET | Freq: Four times a day (QID) | ORAL | Status: DC
  Administered 2014-05-05 – 2014-05-07 (×8): 600 mg via ORAL
  Filled 2014-05-05 (×7): qty 1

## 2014-05-05 MED ORDER — DIBUCAINE 1 % RE OINT
1.0000 "application " | TOPICAL_OINTMENT | RECTAL | Status: DC | PRN
  Administered 2014-05-07: 1 via RECTAL
  Filled 2014-05-05: qty 28

## 2014-05-05 MED ORDER — BENZOCAINE-MENTHOL 20-0.5 % EX AERO: 1.0000 "application " | INHALATION_SPRAY | CUTANEOUS | Status: DC | PRN

## 2014-05-05 MED ORDER — PRENATAL MULTIVITAMIN CH
1.0000 | ORAL_TABLET | Freq: Every day | ORAL | Status: DC
  Administered 2014-05-05 – 2014-05-07 (×3): 1 via ORAL
  Filled 2014-05-05 (×3): qty 1

## 2014-05-05 MED ORDER — DIPHENHYDRAMINE HCL 25 MG PO CAPS: 25.0000 mg | ORAL_CAPSULE | Freq: Four times a day (QID) | ORAL | Status: DC | PRN

## 2014-05-05 MED ORDER — ZOLPIDEM TARTRATE 5 MG PO TABS: 5.0000 mg | ORAL_TABLET | Freq: Every evening | ORAL | Status: DC | PRN

## 2014-05-05 MED ORDER — TETANUS-DIPHTH-ACELL PERTUSSIS 5-2.5-18.5 LF-MCG/0.5 IM SUSP: 0.5000 mL | Freq: Once | INTRAMUSCULAR | Status: DC

## 2014-05-05 MED ORDER — ONDANSETRON HCL 4 MG PO TABS: 4.0000 mg | ORAL_TABLET | ORAL | Status: DC | PRN

## 2014-05-05 MED ORDER — WITCH HAZEL-GLYCERIN EX PADS
1.0000 "application " | MEDICATED_PAD | CUTANEOUS | Status: DC | PRN
  Administered 2014-05-07: 1 via TOPICAL

## 2014-05-05 NOTE — Progress Notes (Signed)
  Subjective: Uncomfortable.  Objective: BP 105/33  Pulse 104  Temp(Src) 97.9 F (36.6 C) (Oral)  Resp 20  Ht  (1.702 m)  Wt 156 lb (70.761 kg)  BMI 24.43 kg/m2  SpO2 100%      FHT: 130 w/ mod variability, mod-severe variables to 60s w/ slow return to baseline. UC:   irregular, every 1-2 minutes SVE: 5/90/+1    Pitocin off  Assessment:  Cat 2 FHRT Rapid descent  Plan: Mod-severe variables noted after AROM of forebag. Intrauterine resuscitative measures taken. Full internals placed and amnioinfusion started. Pt. started to get relief from epidural in high fowler's position. Discussed concerning tracing w/ pt. Informed that if mod-severe variables continue despite interventions, a c-section would be necessary. R&B to include bleeding, infection and damage to organs reviewed. Pt verbalized understanding. Dr. Estanislado Pandy consulted. Will observe closely at this time and continue intrauterine resuscitative measures.  Sherre Scarlet CNM 05/05/2014, 2:18 AM

## 2014-05-05 NOTE — Anesthesia Postprocedure Evaluation (Signed)
Anesthesia Post Note  Patient: Jaime Leonard  Procedure(s) Performed: * No procedures listed *  Anesthesia type: Epidural  Patient location: Mother/Baby  Post pain: Pain level controlled  Post assessment: Post-op Vital signs reviewed  Last Vitals:  Filed Vitals:   05/05/14 1457  BP: 141/77  Pulse: 58  Temp: 36.8 C  Resp: 20    Post vital signs: Reviewed  Level of consciousness:alert  Complications: No apparent anesthesia complications

## 2014-05-05 NOTE — Progress Notes (Addendum)
  Subjective: Crying out in pain. Continues to receive epidural boluses.  Objective: BP 127/77  Pulse 107  Temp(Src) 98.2 F (36.8 C) (Oral)  Resp 22  Ht  (1.702 m)  Wt 156 lb (70.761 kg)  BMI 24.43 kg/m2  SpO2 100% I/O last 3 completed shifts: In: -  Out: 1000 [Urine:1000]    FHT: BL 135 w/ mod variability, +accels, mild-mod variables UC:   irregular, every 2-4 minutes SVE:   Dilation: 7.5 Effacement (%): 80 Station: +1 Exam by:: Landry Lookingbill CNM Pitocin off  Assessment:  Cervical exam unchanged  Plan: Repositioned to optimize relief with Epidural  CTO closely Continue amnioinfusion and intrauterine resuscitative measures  Sherre Scarlet CNM 05/05/2014, 6:35 AM

## 2014-05-05 NOTE — Lactation Note (Signed)
This note was copied from the chart of Jaime Deberah Pelton. Lactation Consultation Note  Patient Name: Jaime Leonard ZOXWR'U Date: 05/05/2014 Reason for consult: Initial assessment of this second-time mother and her newborn at 10 hours postpartum. Mom has 23 yo but states she gave up breastfeeding after 2 weeks but wants to breastfeed this baby longer if possible. LC encouraged frequent STS and cue feedings at breast.  Mom reported that she saw some bloody colostrum with hand expression but denies any nipple pain or trauma.  LC discussed possible "rusty-pipe" (capillary sloughing) from inside nipple but encouraged her to call for latch assistance in case of persistent bleeding or nipple pain with feedings. Mom encouraged to feed baby 8-12 times/24 hours and with feeding cues. LC encouraged review of Baby and Me pp 9, 14 and 20-25 for STS and BF information. LC provided [employer] brochure and reviewed [facility] services and list of community and web site resources.      Maternal Data Has patient been taught Hand Expression?: Yes (mom states she is able to hand express her colostrum; she saw some bloody ebm  w/hand expression) Does the patient have breastfeeding experience prior to this delivery?: Yes  Feeding Feeding Type: Formula  LATCH Score/Interventions      LATCH score at earlier feeding=9 per RN assessment                Lactation Tools Discussed/Used   STS, cue feedings at breast, hand expression  Consult Status Consult Status: Follow-up Date: 05/06/14 Follow-up type: In-patient    [facility] 05/05/2014, 10:10 PM

## 2014-05-05 NOTE — Progress Notes (Signed)
  Subjective: Sleeping, yet easily aroused.  Objective: BP 94/49  Pulse 68  Temp(Src) 98.2 F (36.8 C) (Oral)  Resp 20  Ht  (1.702 m)  Wt 156 lb (70.761 kg)  BMI 24.43 kg/m2  SpO2 100%      FHT: BL 130 w/ mod variability, +accels, +variables  UC:   irregular, every 2-4 minutes SVE: 7-8/80-90/0-+1   Assessment:  Active labor GBS positive H/O shoulder dystocia in previous pregnancy  Plan: Continue w/ current management plan Consult as indicated  Sherre Scarlet CNM 05/05/2014, 6:05 AM

## 2014-05-05 NOTE — Progress Notes (Signed)
  Subjective: Comfortable w/ epidural in high fowler's position.  Objective: BP 122/70  Pulse 69  Temp(Src) 98.1 F (36.7 C) (Axillary)  Resp 20  Ht  (1.702 m)  Wt 156 lb (70.761 kg)  BMI 24.43 kg/m2  SpO2 100%      FHT: BL 135 w/ mod variability, +accels, mod variables with every other ctx  UC:   irregular, every 2-3 minutes, IUPC not tracing SVE:   Dilation: 5 Effacement (%): 90 Station: +1 Exam by:: Nevah Dalal CNM Pitocin off  Assessment:  NICHD, Cat 2  Plan: IUPC repositioned Continue amnioinfusion CTO closely  Sherre Scarlet CNM 05/05/2014, 2:48 AM

## 2014-05-05 NOTE — Progress Notes (Addendum)
  Subjective: Feeling pressure. Epidural placed 2 hrs ago.  Objective: BP 104/60  Pulse 68  Temp(Src) 97.9 F (36.6 C) (Oral)  Resp 18  Ht  (1.702 m)  Wt 156 lb (70.761 kg)  BMI 24.43 kg/m2  SpO2 100%     FHT: BL 140 w/ mod variability, +accels, earlys UC:   irregular, every 3-4 minutes SVE: 4-5/80/-1, palpable forebag; pt unable to fully tolerate exam   Pitocin at 8 mius/min  Assessment:  Latent labor GBS positive NICHD, Cat 2 due to earlys  Plan: AROM'd forebag, clear fluid Epidural bolus Place IUPC when comfortable w/ epidural; next exam Consult as indicated Expect progress and SVD  Jaime Leonard CNM 05/05/2014, 1:02 AM

## 2014-05-06 LAB — CBC
HCT: 29.2 % — ABNORMAL LOW (ref 36.0–46.0)
Hemoglobin: 10 g/dL — ABNORMAL LOW (ref 12.0–15.0)
MCH: 27.7 pg (ref 26.0–34.0)
MCHC: 34.2 g/dL (ref 30.0–36.0)
MCV: 80.9 fL (ref 78.0–100.0)
PLATELETS: 253 10*3/uL (ref 150–400)
RBC: 3.61 MIL/uL — ABNORMAL LOW (ref 3.87–5.11)
RDW: 12.6 % (ref 11.5–15.5)
WBC: 17.2 10*3/uL — AB (ref 4.0–10.5)

## 2014-05-06 NOTE — Lactation Note (Signed)
This note was copied from the chart of Jaime Leonard. Lactation Consultation Note  Patient Name: Jaime Leonard ZOXWR'U Date: 05/06/2014 Reason for consult: Follow-up assessment Per mom the last feeding was 2 hours ago and I bottle fed 15 ml. LC assessed baby mouth, small labia frenulum, pointed  tongue ,but able to stretch over the gum line. Baby awake and rooting, LC checked the diaper, dry, assisted mom with latching on the right breast in football position  And pillow support. Worked on depth , baby sustained latch and consistent feeding pattern for 14 mins and released on her own. Baby still rooting, LC assisted latch on the left breast , cross cradle, with depth , mom did feel some discomfort  and eased up  with breast compressions. Depth at the breast achieved and baby in a consistent swallowing pattern.  Mom has already been using comfort gels , LC instructed on use of shells and if needed to pre-pump after breast massage , hand Express. Left nipple is tender, but no breakdown noted. Mom seemed excited the baby latched so well.   Maternal Data Has patient been taught Hand Expression?: Yes (colostrum noted )  Feeding Feeding Type: Breast Fed Nipple Type: Regular Length of feed: 14 min  LATCH Score/Interventions Latch: Grasps breast easily, tongue down, lips flanged, rhythmical sucking.  Audible Swallowing: Spontaneous and intermittent  Type of Nipple: Everted at rest and after stimulation  Comfort (Breast/Nipple): Filling, red/small blisters or bruises, mild/mod discomfort  Problem noted: Mild/Moderate discomfort Interventions (Mild/moderate discomfort): Hand massage;Hand expression;Pre-pump if needed;Comfort gels  Hold (Positioning): Assistance needed to correctly position infant at breast and maintain latch. Intervention(s): Breastfeeding basics reviewed;Support Pillows;Position options;Skin to skin  LATCH Score: 8  Lactation Tools Discussed/Used Tools:  Comfort gels;Shells;Pump Shell Type: Inverted Breast pump type: Manual Pump Review: Setup, frequency, and cleaning Initiated by:: MAI  Date initiated:: 05/06/14   Consult Status Consult Status: Follow-up Date: 05/07/14 Follow-up type: In-patient    Kathrin Greathouse 05/06/2014, 3:04 PM

## 2014-05-06 NOTE — Progress Notes (Signed)
Jaime Leonard    Subjective: Post Partum Day 1 Vaginal delivery, no laceration Patient up ad lib, denies syncope or dizziness. Reports consuming regular diet without issues and denies N/V No issues with urination and reports bleeding is appropriate  Feeding:  breastfeeding Contraceptive plan:   Depo  Objective: Temp:  [97.7 F (36.5 C)-98.4 F (36.9 C)] 97.7 F (36.5 C) (09/04 0939) Pulse Rate:  [58-83] 75 (09/04 0939) Resp:  [18-20] 20 (09/04 0939) BP: (110-144)/(51-114) 118/62 mmHg (09/04 0939)  Physical Exam:  General: alert and cooperative Ext: WNL, no edema. No evidence of DVT seen on physical exam. Breast: Soft filling Lungs: CTAB Heart RRR without murmur  Abdomen:  Soft, fundus firm, lochia scant, + bowel sounds, non distended, non tender Lochia: appropriate Uterine Fundus: firm Laceration: n/a    Recent Labs  05/04/14 1838 05/06/14 0610  HGB 11.4* 10.0*  HCT 33.1* 29.2*    Assessment S/P Vaginal Delivery-Day 1 Stable  Normal [employer] Breastfeeding  Plan: Continue current care Dr. Su Hilt updated on patient status  Plan for discharge tomorrow, Breastfeeding, Lactation consult and Contraception Depo prior to discharge Lactation support   Daysean Tinkham, CNM, MSN 05/06/2014, 9:48 AM

## 2014-05-07 MED ORDER — FERROUS SULFATE 325 (65 FE) MG PO TABS: 325.0000 mg | ORAL_TABLET | Freq: Every day | ORAL | Status: DC

## 2014-05-07 MED ORDER — HYDROCODONE-ACETAMINOPHEN 5-325 MG PO TABS: 1.0000 | ORAL_TABLET | ORAL | Status: DC | PRN

## 2014-05-07 MED ORDER — FERROUS SULFATE 325 (65 FE) MG PO TABS
325.0000 mg | ORAL_TABLET | Freq: Two times a day (BID) | ORAL | Status: DC
  Administered 2014-05-07: 325 mg via ORAL
  Filled 2014-05-07: qty 1

## 2014-05-07 MED ORDER — IBUPROFEN 600 MG PO TABS: 600.0000 mg | ORAL_TABLET | Freq: Four times a day (QID) | ORAL | Status: DC | PRN

## 2014-05-07 NOTE — Discharge Instructions (Signed)

## 2014-05-07 NOTE — Lactation Note (Signed)
This note was copied from the chart of Jaime Leonard. Lactation Consultation Note: Mom has given bottles of formula through the night. Breasts are full this morning. Mom reports that her nipples are sore- is using lanolin and has comfort gels. Offered assist with latch. It has been about 6 hours since last feeding. Baby sucking on pacifier. Encouraged mom to feed whenever she sees feeding cues instead of giving pacifier. Baby latched well with lots of audible swallows. Mom reports that breast feels softer after nursing. Reviewed basic teaching with mom. Baby off to sleep. Reviewed engorgement prevention and treatment. Mom reports that manual pump hurts- # 27 flange given and mom reports that feels much better. No questions at present. To call prn.   Patient Name: Jaime Jaskiran Pata ZOXWR'U Date: 05/07/2014 Reason for consult: Follow-up assessment   Maternal Data Formula Feeding for Exclusion: Yes Reason for exclusion: Mother's choice to formula and breast feed on admission Does the patient have breastfeeding experience prior to this delivery?: No  Feeding Feeding Type: Breast Fed Length of feed: 20 min  LATCH Score/Interventions Latch: Grasps breast easily, tongue down, lips flanged, rhythmical sucking.  Audible Swallowing: Spontaneous and intermittent  Type of Nipple: Flat  Comfort (Breast/Nipple): Filling, red/small blisters or bruises, mild/mod discomfort  Problem noted: Mild/Moderate discomfort;Filling  Hold (Positioning): Assistance needed to correctly position infant at breast and maintain latch. Intervention(s): Breastfeeding basics reviewed;Support Pillows;Position options  LATCH Score: 7  Lactation Tools Discussed/Used Tools: Lanolin Shell Type: Inverted Breast pump type: Manual Pump Review: Setup, frequency, and cleaning   Consult Status Consult Status: Complete    Pamelia Hoit 05/07/2014, 9:25 AM
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
## 2014-05-08 ENCOUNTER — Inpatient Hospital Stay (HOSPITAL_COMMUNITY): Admission: RE | Admit: 2014-05-08 | Payer: Medicaid Other | Source: Ambulatory Visit

## 2014-05-10 ENCOUNTER — Inpatient Hospital Stay (HOSPITAL_COMMUNITY): Admission: RE | Admit: 2014-05-10 | Payer: Medicaid Other | Source: Ambulatory Visit

## 2014-07-04 ENCOUNTER — Encounter (HOSPITAL_COMMUNITY): Payer: Self-pay | Admitting: *Deleted

## 2015-08-20 ENCOUNTER — Emergency Department (HOSPITAL_COMMUNITY): Payer: Medicaid Other

## 2015-08-20 ENCOUNTER — Emergency Department (HOSPITAL_COMMUNITY)
Admission: EM | Admit: 2015-08-20 | Discharge: 2015-08-20 | Disposition: A | Discharge status: Home / Self Care | Payer: Medicaid Other

## 2015-08-20 ENCOUNTER — Encounter (HOSPITAL_COMMUNITY): Payer: Self-pay | Admitting: Nurse Practitioner

## 2015-08-20 DIAGNOSIS — Z8619 Personal history of other infectious and parasitic diseases: Secondary | ICD-10-CM | POA: Diagnosis not present

## 2015-08-20 DIAGNOSIS — S0093XA Contusion of unspecified part of head, initial encounter: Secondary | ICD-10-CM | POA: Diagnosis not present

## 2015-08-20 DIAGNOSIS — S40021A Contusion of right upper arm, initial encounter: Secondary | ICD-10-CM

## 2015-08-20 DIAGNOSIS — S0081XA Abrasion of other part of head, initial encounter: Secondary | ICD-10-CM | POA: Diagnosis not present

## 2015-08-20 DIAGNOSIS — S0990XA Unspecified injury of head, initial encounter: Secondary | ICD-10-CM | POA: Diagnosis present

## 2015-08-20 DIAGNOSIS — Z8742 Personal history of other diseases of the female genital tract: Secondary | ICD-10-CM | POA: Diagnosis not present

## 2015-08-20 DIAGNOSIS — Y9389 Activity, other specified: Secondary | ICD-10-CM | POA: Insufficient documentation

## 2015-08-20 DIAGNOSIS — Y92410 Unspecified street and highway as the place of occurrence of the external cause: Secondary | ICD-10-CM | POA: Diagnosis not present

## 2015-08-20 DIAGNOSIS — S5011XA Contusion of right forearm, initial encounter: Secondary | ICD-10-CM | POA: Diagnosis not present

## 2015-08-20 DIAGNOSIS — S060X9A Concussion with loss of consciousness of unspecified duration, initial encounter: Secondary | ICD-10-CM | POA: Diagnosis not present

## 2015-08-20 DIAGNOSIS — Z79899 Other long term (current) drug therapy: Secondary | ICD-10-CM | POA: Insufficient documentation

## 2015-08-20 DIAGNOSIS — Y998 Other external cause status: Secondary | ICD-10-CM | POA: Insufficient documentation

## 2015-08-20 DIAGNOSIS — S60511A Abrasion of right hand, initial encounter: Secondary | ICD-10-CM | POA: Diagnosis not present

## 2015-08-20 MED ORDER — ONDANSETRON HCL 4 MG/2ML IJ SOLN
4.0000 mg | Freq: Once | INTRAMUSCULAR | Status: AC
  Administered 2015-08-20: 4 mg via INTRAVENOUS
  Filled 2015-08-20: qty 2

## 2015-08-20 MED ORDER — HYDROMORPHONE HCL 1 MG/ML IJ SOLN
1.0000 mg | Freq: Once | INTRAMUSCULAR | Status: AC
  Administered 2015-08-20: 1 mg via INTRAVENOUS
  Filled 2015-08-20: qty 1

## 2015-08-20 MED ORDER — MORPHINE SULFATE (PF) 4 MG/ML IV SOLN
4.0000 mg | Freq: Once | INTRAVENOUS | Status: AC
  Administered 2015-08-20: 4 mg via INTRAVENOUS
  Filled 2015-08-20: qty 1

## 2015-08-20 MED ORDER — IBUPROFEN 800 MG PO TABS: 800.0000 mg | ORAL_TABLET | Freq: Three times a day (TID) | ORAL | Status: DC

## 2015-08-20 MED ORDER — KETOROLAC TROMETHAMINE 30 MG/ML IJ SOLN
30.0000 mg | Freq: Once | INTRAMUSCULAR | Status: AC
  Administered 2015-08-20: 30 mg via INTRAVENOUS
  Filled 2015-08-20: qty 1

## 2015-08-20 MED ORDER — OXYCODONE-ACETAMINOPHEN 5-325 MG PO TABS: 1.0000 | ORAL_TABLET | ORAL | Status: DC | PRN

## 2015-08-20 MED ORDER — METHOCARBAMOL 500 MG PO TABS: 500.0000 mg | ORAL_TABLET | Freq: Two times a day (BID) | ORAL | Status: DC
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
## 2015-08-20 NOTE — ED Notes (Signed)
Pt transported xray.  

## 2015-08-20 NOTE — ED Notes (Signed)
Patient transported to CT 

## 2016-09-08 ENCOUNTER — Encounter (HOSPITAL_COMMUNITY): Payer: Self-pay | Admitting: *Deleted

## 2016-09-08 ENCOUNTER — Emergency Department (HOSPITAL_COMMUNITY)
Admission: EM | Admit: 2016-09-08 | Discharge: 2016-09-08 | Disposition: A | Discharge status: Home / Self Care | Payer: Medicaid Other | Attending: Emergency Medicine | Admitting: Emergency Medicine

## 2016-09-08 ENCOUNTER — Emergency Department (HOSPITAL_COMMUNITY): Payer: Medicaid Other

## 2016-09-08 DIAGNOSIS — Y92009 Unspecified place in unspecified non-institutional (private) residence as the place of occurrence of the external cause: Secondary | ICD-10-CM | POA: Diagnosis not present

## 2016-09-08 DIAGNOSIS — M25461 Effusion, right knee: Secondary | ICD-10-CM | POA: Diagnosis not present

## 2016-09-08 DIAGNOSIS — S8391XA Sprain of unspecified site of right knee, initial encounter: Secondary | ICD-10-CM | POA: Diagnosis not present

## 2016-09-08 DIAGNOSIS — Y999 Unspecified external cause status: Secondary | ICD-10-CM | POA: Insufficient documentation

## 2016-09-08 DIAGNOSIS — X501XXA Overexertion from prolonged static or awkward postures, initial encounter: Secondary | ICD-10-CM | POA: Insufficient documentation

## 2016-09-08 DIAGNOSIS — Y9341 Activity, dancing: Secondary | ICD-10-CM | POA: Insufficient documentation

## 2016-09-08 DIAGNOSIS — M25561 Pain in right knee: Secondary | ICD-10-CM

## 2016-09-08 MED ORDER — HYDROCODONE-ACETAMINOPHEN 5-325 MG PO TABS
1.0000 | ORAL_TABLET | Freq: Once | ORAL | Status: AC
  Administered 2016-09-08: 1 via ORAL
  Filled 2016-09-08: qty 1

## 2016-09-08 MED ORDER — NAPROXEN 500 MG PO TABS: 500.0000 mg | ORAL_TABLET | Freq: Two times a day (BID) | ORAL | 0 refills | Status: DC

## 2016-09-08 MED ORDER — HYDROCODONE-ACETAMINOPHEN 5-325 MG PO TABS: 1.0000 | ORAL_TABLET | Freq: Four times a day (QID) | ORAL | 0 refills | Status: DC | PRN
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
## 2016-09-08 NOTE — ED Triage Notes (Signed)
Pt was dancing at home and jumped up and when she landed, her right knee felt like it "poped" out.  Pt reports doing something to put the knee "back into place." Pt ambulating with a limp and swelling noted to the knee.

## 2016-09-08 NOTE — Discharge Instructions (Signed)
Wear knee immobilizer for at least 2 weeks for stabilization of knee. Use crutches as needed for comfort. Ice and elevate knee throughout the day, using ice pack for no more than 20 minutes every hour. Alternate between naprosyn and norco for pain relief. Do not drive or operate machinery with pain medication use. Call orthopedic follow up today or tomorrow to schedule followup appointment for recheck of ongoing knee pain in 1-2 weeks that can be canceled with a 24-48 hour notice if complete resolution of pain. Return to the ER for changes or worsening symptoms.

## 2017-02-18 ENCOUNTER — Inpatient Hospital Stay (HOSPITAL_COMMUNITY)
Admission: AD | Admit: 2017-02-18 | Discharge: 2017-02-18 | Disposition: A | Discharge status: Home / Self Care | Payer: Medicaid Other | Source: Ambulatory Visit | Attending: Family Medicine | Admitting: Family Medicine

## 2017-02-18 ENCOUNTER — Encounter (HOSPITAL_COMMUNITY): Payer: Self-pay

## 2017-02-18 ENCOUNTER — Inpatient Hospital Stay (HOSPITAL_COMMUNITY): Payer: Medicaid Other

## 2017-02-18 DIAGNOSIS — Z3A01 Less than 8 weeks gestation of pregnancy: Secondary | ICD-10-CM | POA: Diagnosis not present

## 2017-02-18 DIAGNOSIS — O209 Hemorrhage in early pregnancy, unspecified: Secondary | ICD-10-CM

## 2017-02-18 DIAGNOSIS — O418X10 Other specified disorders of amniotic fluid and membranes, first trimester, not applicable or unspecified: Secondary | ICD-10-CM

## 2017-02-18 DIAGNOSIS — N939 Abnormal uterine and vaginal bleeding, unspecified: Secondary | ICD-10-CM

## 2017-02-18 DIAGNOSIS — O468X1 Other antepartum hemorrhage, first trimester: Secondary | ICD-10-CM

## 2017-02-18 LAB — URINALYSIS, ROUTINE W REFLEX MICROSCOPIC
Bilirubin Urine: NEGATIVE
GLUCOSE, UA: NEGATIVE mg/dL
Ketones, ur: 20 mg/dL — AB
Leukocytes, UA: NEGATIVE
NITRITE: NEGATIVE
PROTEIN: 100 mg/dL — AB
Specific Gravity, Urine: 1.024 (ref 1.005–1.030)
pH: 6 (ref 5.0–8.0)

## 2017-02-18 LAB — CBC
HEMATOCRIT: 35.3 % — AB (ref 36.0–46.0)
Hemoglobin: 12.2 g/dL (ref 12.0–15.0)
MCH: 29.3 pg (ref 26.0–34.0)
MCHC: 34.6 g/dL (ref 30.0–36.0)
MCV: 84.9 fL (ref 78.0–100.0)
Platelets: 219 10*3/uL (ref 150–400)
RBC: 4.16 MIL/uL (ref 3.87–5.11)
RDW: 12.2 % (ref 11.5–15.5)
WBC: 11.2 10*3/uL — ABNORMAL HIGH (ref 4.0–10.5)

## 2017-02-18 LAB — HCG, QUANTITATIVE, PREGNANCY: HCG, BETA CHAIN, QUANT, S: 129195 m[IU]/mL — AB (ref <5)

## 2017-02-18 LAB — POCT PREGNANCY, URINE: Preg Test, Ur: POSITIVE — AB

## 2017-02-18 NOTE — Progress Notes (Addendum)
G4P2 @ ? Last LMP May 5. States started bleeding less than a period but bright red an hr ago while eating gold fish. 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   1915: Provider at bs assessing pt. Speculum exam done. Lots of bleeding noted. 3 big clots into basin kept at bs. Ordered for U/S.   Lab at bs  2000: Pt still in U/S

## 2017-02-18 NOTE — Discharge Instructions (Signed)
Subchorionic Hematoma °A subchorionic hematoma is a gathering of blood between the outer wall of the placenta and the inner wall of the womb (uterus). The placenta is the organ that connects the fetus to the wall of the uterus. The placenta performs the feeding, breathing (oxygen to the fetus), and waste removal (excretory work) of the fetus. °Subchorionic hematoma is the most common abnormality found on a result from ultrasonography done during the first trimester or early second trimester of pregnancy. If there has been little or no vaginal bleeding, early small hematomas usually shrink on their own and do not affect your baby or pregnancy. The blood is gradually absorbed over 1-2 weeks. When bleeding starts later in pregnancy or the hematoma is larger or occurs in an older pregnant woman, the outcome may not be as good. Larger hematomas may get bigger, which increases the chances for miscarriage. Subchorionic hematoma also increases the risk of premature detachment of the placenta from the uterus, preterm (premature) labor, and stillbirth. °Follow these instructions at home: °· Stay on bed rest if your health care provider recommends this. Although bed rest will not prevent more bleeding or prevent a miscarriage, your health care provider may recommend bed rest until you are advised otherwise. °· Avoid heavy lifting (more than 10 lb [4.5 kg]), exercise, sexual intercourse, or douching as directed by your health care provider. °· Keep track of the number of pads you use each day and how soaked (saturated) they are. Write down this information. °· Do not use tampons. °· Keep all follow-up appointments as directed by your health care provider. Your health care provider may ask you to have follow-up blood tests or ultrasound tests or both. °Get help right away if: °· You have severe cramps in your stomach, back, abdomen, or pelvis. °· You have a fever. °· You pass large clots or tissue. Save any tissue for your  health care provider to look at. °· Your bleeding increases or you become lightheaded, feel weak, or have fainting episodes. °This information is not intended to replace advice given to you by your health care provider. Make sure you discuss any questions you have with your health care provider. °Document Released: 12/04/2006 Document Revised: 01/25/2016 Document Reviewed: 03/18/2013 °Elsevier Interactive Patient Education © 2017 [employer] ° °

## 2017-02-18 NOTE — MAU Provider Note (Signed)
History     CSN: [id number]  Arrival date and time: 02/18/17 1831   First Provider Initiated Contact with Patient 02/18/17 1912      Chief Complaint  Patient presents with  . Vaginal Bleeding  . Abdominal Cramping   HPI Jaime Leonard is a 26 y.o. [phone] at [redacted]w[redacted]d who presents complaining of vaginal bleeding that started today. She reports bright red when she wipes but less than a period. She reports intermittent abdominal pain that started when the bleeding did but reports no pain now. She reports regular periods, LMP 01/04/17 and last intercourse was 2 days ago.   OB History    Gravida Para Term Preterm AB Living   5 2 2   2 2    SAB TAB Ectopic Multiple Live Births   1 1     2       Past Medical History:  Diagnosis Date  . History of chicken pox   . Ovarian cyst   . Trichomonas contact, treated   . Yeast infection     Past Surgical History:  Procedure Laterality Date  . DILATION AND EVACUATION N/A 05/10/2013   Procedure: DILATATION AND EVACUATION;  Surgeon: Purcell Nails, MD;  Location: [facility] OR[facility];  Service: Gynecology;  Laterality: N/A;  . INDUCED ABORTION  06/05/2012   elective  . WISDOM TOOTH EXTRACTION      Family History  Problem Relation Age of Onset  . Hypertension Mother   . Cancer Mother   . Hypertension Maternal Grandmother   . Diabetes Maternal Grandmother   . Kidney failure Maternal Grandmother   . Stroke Maternal Grandmother   . Diabetes Paternal Grandmother   . Diabetes Father     Social History  Substance Use Topics  . Smoking status: Never Smoker  . Smokeless tobacco: Never Used  . Alcohol use 0.5 oz/week    1 Standard drinks or equivalent per week     Comment: rare    Allergies:  Allergies  Allergen Reactions  . Shellfish Allergy Hives    Prescriptions Prior to Admission  Medication Sig Dispense Refill Last Dose  . Prenatal Vit-Fe Fumarate-FA (PRENATAL MULTIVITAMIN) TABS tablet Take 1 tablet by mouth daily at 12 noon.   02/18/2017 at  Unknown time  . HYDROcodone-acetaminophen (NORCO) 5-325 MG tablet Take 1 tablet by mouth every 6 (six) hours as needed for severe pain. (Patient not taking: Reported on 02/18/2017) 6 tablet 0 Not Taking at Unknown time  . naproxen (NAPROSYN) 500 MG tablet Take 1 tablet (500 mg total) by mouth 2 (two) times daily with a meal. (Patient not taking: Reported on 02/18/2017) 20 tablet 0 Not Taking at Unknown time    Review of Systems  Constitutional: Negative.  Negative for fatigue and fever.  Respiratory: Negative.  Negative for shortness of breath.   Gastrointestinal: Positive for abdominal pain. Negative for constipation, diarrhea, nausea and vomiting.  Genitourinary: Positive for vaginal bleeding. Negative for vaginal discharge.  Neurological: Negative.    Physical Exam   Blood pressure 116/69, pulse 71, temperature 98.1 F (36.7 C), temperature source Oral, resp. rate 16, last menstrual period 01/04/2017, unknown if currently breastfeeding.  Physical Exam  Nursing note and vitals reviewed. Constitutional: She appears well-developed and well-nourished.  HENT:  Head: Normocephalic and atraumatic.  Eyes: Conjunctivae are normal. No scleral icterus.  Cardiovascular: Normal rate, regular rhythm and normal heart sounds.   Respiratory: Effort normal and breath sounds normal. No respiratory distress.  GI: Soft. She exhibits no distension. There  is no tenderness.  Genitourinary: Cervix exhibits no motion tenderness. There is bleeding in the vagina.  Genitourinary Comments: Moderate amount of bright red bleeding in vagina, several large clots removed.   Neurological: She is alert.  Skin: Skin is warm and dry.  Psychiatric: She has a normal mood and affect. Her behavior is normal. Judgment and thought content normal.   Bimanual exam: Cervix 0/long/high, firm, anterior, neg CMT, uterus nontender, adnexa without tenderness, enlargement, or mass   Results for orders placed or performed during the  hospital encounter of 02/18/17 (from the past 24 hour(s))  Urinalysis, Routine w reflex microscopic     Status: Abnormal   Collection Time: 02/18/17  6:40 PM  Result Value Ref Range   Color, Urine AMBER (A) YELLOW   APPearance HAZY (A) CLEAR   Specific Gravity, Urine 1.024 1.005 - 1.030   pH 6.0 5.0 - 8.0   Glucose, UA NEGATIVE NEGATIVE mg/dL   Hgb urine dipstick LARGE (A) NEGATIVE   Bilirubin Urine NEGATIVE NEGATIVE   Ketones, ur 20 (A) NEGATIVE mg/dL   Protein, ur 409100 (A) NEGATIVE mg/dL   Nitrite NEGATIVE NEGATIVE   Leukocytes, UA NEGATIVE NEGATIVE   RBC / HPF TOO NUMEROUS TO COUNT 0 - 5 RBC/hpf   WBC, UA 6-30 0 - 5 WBC/hpf   Bacteria, UA FEW (A) NONE SEEN   Squamous Epithelial / LPF 0-5 (A) NONE SEEN   Mucous PRESENT   Pregnancy, urine POC     Status: Abnormal   Collection Time: 02/18/17  7:09 PM  Result Value Ref Range   Preg Test, Ur POSITIVE (A) NEGATIVE  CBC     Status: Abnormal   Collection Time: 02/18/17  7:29 PM  Result Value Ref Range   WBC 11.2 (H) 4.0 - 10.5 K/uL   RBC 4.16 3.87 - 5.11 MIL/uL   Hemoglobin 12.2 12.0 - 15.0 g/dL   HCT 81.135.3 (L) 91.436.0 - 78.246.0 %   MCV 84.9 78.0 - 100.0 fL   MCH 29.3 26.0 - 34.0 pg   MCHC 34.6 30.0 - 36.0 g/dL   RDW 95.612.2 21.311.5 - 08.615.5 %   Platelets 219 150 - 400 K/uL  hCG, quantitative, pregnancy     Status: Abnormal   Collection Time: 02/18/17  7:29 PM  Result Value Ref Range   hCG, Beta Chain, Quant, S 129,195 (H) <5 mIU/mL   [place] Ob Comp Less 14 Wks  Result Date: 02/18/2017 CLINICAL DATA:  26 y/o  F; vaginal bleeding. EXAM: OBSTETRIC <14 WK US AND TRANSVAGINAL OB US TECHNIQUE: Both transabdominal and transvaginal ultrasound examinations were performed for complete evaluation of the gestation as well as the maternal uterus, adnexal regions, and pelvic cul-de-sac. Transvaginal technique was performed to assess early pregnancy. COMPARISON:  04/30/2014 pelvic ultrasound FINDINGS: Intrauterine gestational sac: Single, mild irregularity of  the contour of gestational sac, probably due to mass effect from subchorionic hemorrhage. Yolk sac:  Visualized. Embryo:  Visualized. Cardiac Activity: Visualized. Heart Rate: 129  bpm MSD: 29  mm   7 w   6  d CRL:  9  mm   7 w   0 d                  US EDC: 10/04/2017 Subchorionic hemorrhage: Subchorionic fluid collection measuring 2.9 x 0.9 x 2.6 cm compatible with subchorionic hemorrhage. Maternal uterus/adnexae: Right ovary not visualized. Left ovary corpus luteum cyst. Small volume of free fluid in the pelvis. IMPRESSION: 1. Single live intrauterine pregnancy  with estimated gestational age of [redacted] weeks and 0 days by crown-rump length. 2. Moderate size subchorionic hemorrhage. Electronically Signed   By: Mitzi Hansen M.D.   On: 02/18/2017 20:23   US Ob Transvaginal  Result Date: 02/18/2017 CLINICAL DATA:  26 y/o  F; vaginal bleeding. EXAM: OBSTETRIC <14 WK [place] AND TRANSVAGINAL OB US TECHNIQUE: Both transabdominal and transvaginal ultrasound examinations were performed for complete evaluation of the gestation as well as the maternal uterus, adnexal regions, and pelvic cul-de-sac. Transvaginal technique was performed to assess early pregnancy. COMPARISON:  04/30/2014 pelvic ultrasound FINDINGS: Intrauterine gestational sac: Single, mild irregularity of the contour of gestational sac, probably due to mass effect from subchorionic hemorrhage. Yolk sac:  Visualized. Embryo:  Visualized. Cardiac Activity: Visualized. Heart Rate: 129  bpm MSD: 29  mm   7 w   6  d CRL:  9  mm   7 w   0 d                  [place] EDC: 10/04/2017 Subchorionic hemorrhage: Subchorionic fluid collection measuring 2.9 x 0.9 x 2.6 cm compatible with subchorionic hemorrhage. Maternal uterus/adnexae: Right ovary not visualized. Left ovary corpus luteum cyst. Small volume of free fluid in the pelvis. IMPRESSION: 1. Single live intrauterine pregnancy with estimated gestational age of [redacted] weeks and 0 days by crown-rump length. 2. Moderate  size subchorionic hemorrhage. Electronically Signed   By: Mitzi Hansen M.D.   On: 02/18/2017 20:23    MAU Course  Procedures  MDM UA, UPT CBC, quant, HIV B Pos blood type U/S OB Transvaginal U/S OB Comp Less 14 Wks  Assessment and Plan   1. Vaginal bleeding in pregnancy, first trimester   2. Vaginal bleeding   3. Subchorionic hemorrhage of placenta in first trimester, single or unspecified fetus    DC home Comfort measures reviewed  1st Trimester precautions  Bleeding precautions RX: none  Return to MAU as needed FU with OB as planned  Follow-up Information    Department, [facility] Follow up.   Contact information: [address] [place] [address] [phone]           Cleone Slim SNM 02/18/2017, 8:10 PM   I confirm that I have verified the information documented in the nurse midwife student's note and that I have also personally reperformed the physical exam and all medical decision making activities.   Thressa Sheller 8:49 PM 02/18/17

## 2017-02-19 LAB — HIV ANTIBODY (ROUTINE TESTING W REFLEX): HIV SCREEN 4TH GENERATION: NONREACTIVE

## 2017-03-28 LAB — OB RESULTS CONSOLE RUBELLA ANTIBODY, IGM: Rubella: IMMUNE

## 2017-03-28 LAB — OB RESULTS CONSOLE ABO/RH: RH TYPE: POSITIVE

## 2017-03-28 LAB — OB RESULTS CONSOLE GC/CHLAMYDIA
CHLAMYDIA, DNA PROBE: NEGATIVE
Gonorrhea: NEGATIVE

## 2017-03-28 LAB — OB RESULTS CONSOLE HEPATITIS B SURFACE ANTIGEN: HEP B S AG: NEGATIVE

## 2017-03-28 LAB — OB RESULTS CONSOLE ANTIBODY SCREEN: Antibody Screen: NEGATIVE

## 2017-03-28 LAB — OB RESULTS CONSOLE HIV ANTIBODY (ROUTINE TESTING): HIV: NONREACTIVE

## 2017-03-28 LAB — OB RESULTS CONSOLE RPR: RPR: NONREACTIVE

## 2017-09-02 NOTE — L&D Delivery Note (Signed)
Delivery Note At  1051 a viable female was delivered via  (Presentation:vtx ;  loa).  APGAR:9 , ; weight  .  pending Placenta status: intact sent to pathology, .  Cord:  with the following complications: velamentous cord insertioon.    Anesthesia:  epidural Episiotomy:   Lacerations:  none Suture Repair:  Est. Blood Loss (mL):   200 Mom to postpartum.  Baby to Couplet care / Skin to Skin.  Jaime Leonard [name] CNM 10/04/2017, 11:30 AM

## 2017-09-04 LAB — OB RESULTS CONSOLE GBS: STREP GROUP B AG: NEGATIVE

## 2017-09-25 ENCOUNTER — Other Ambulatory Visit: Payer: Self-pay | Admitting: Obstetrics and Gynecology

## 2017-09-29 ENCOUNTER — Telehealth (HOSPITAL_COMMUNITY): Payer: Self-pay | Admitting: *Deleted

## 2017-09-29 ENCOUNTER — Encounter (HOSPITAL_COMMUNITY): Payer: Self-pay | Admitting: *Deleted

## 2017-09-29 NOTE — Telephone Encounter (Signed)
Preadmission screen  
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
## 2017-10-04 ENCOUNTER — Inpatient Hospital Stay (HOSPITAL_COMMUNITY): Payer: Medicaid Other | Admitting: Anesthesiology

## 2017-10-04 ENCOUNTER — Other Ambulatory Visit: Payer: Self-pay

## 2017-10-04 ENCOUNTER — Encounter (HOSPITAL_COMMUNITY): Payer: Self-pay

## 2017-10-04 ENCOUNTER — Inpatient Hospital Stay (HOSPITAL_COMMUNITY)
Admission: RE | Admit: 2017-10-04 | Discharge: 2017-10-06 | DRG: 807 | Disposition: A | Discharge status: Home / Self Care | Payer: Medicaid Other | Source: Ambulatory Visit | Attending: Obstetrics and Gynecology | Admitting: Obstetrics and Gynecology

## 2017-10-04 DIAGNOSIS — D649 Anemia, unspecified: Secondary | ICD-10-CM | POA: Diagnosis present

## 2017-10-04 DIAGNOSIS — O9902 Anemia complicating childbirth: Secondary | ICD-10-CM | POA: Diagnosis present

## 2017-10-04 DIAGNOSIS — Z3A40 40 weeks gestation of pregnancy: Secondary | ICD-10-CM | POA: Diagnosis not present

## 2017-10-04 DIAGNOSIS — Z880 Allergy status to penicillin: Secondary | ICD-10-CM | POA: Diagnosis not present

## 2017-10-04 DIAGNOSIS — O43123 Velamentous insertion of umbilical cord, third trimester: Principal | ICD-10-CM | POA: Diagnosis present

## 2017-10-04 DIAGNOSIS — Z91013 Allergy to seafood: Secondary | ICD-10-CM | POA: Diagnosis present

## 2017-10-04 DIAGNOSIS — F319 Bipolar disorder, unspecified: Secondary | ICD-10-CM | POA: Diagnosis present

## 2017-10-04 LAB — CBC
HCT: 31.2 % — ABNORMAL LOW (ref 36.0–46.0)
HEMOGLOBIN: 10.7 g/dL — AB (ref 12.0–15.0)
MCH: 29.1 pg (ref 26.0–34.0)
MCHC: 34.3 g/dL (ref 30.0–36.0)
MCV: 84.8 fL (ref 78.0–100.0)
PLATELETS: 251 10*3/uL (ref 150–400)
RBC: 3.68 MIL/uL — AB (ref 3.87–5.11)
RDW: 13.5 % (ref 11.5–15.5)
WBC: 11.3 10*3/uL — AB (ref 4.0–10.5)

## 2017-10-04 LAB — TYPE AND SCREEN
ABO/RH(D): B POS
ANTIBODY SCREEN: NEGATIVE

## 2017-10-04 LAB — RPR: RPR Ser Ql: NONREACTIVE

## 2017-10-04 LAB — ABO/RH: ABO/RH(D): B POS

## 2017-10-04 MED ORDER — TETANUS-DIPHTH-ACELL PERTUSSIS 5-2.5-18.5 LF-MCG/0.5 IM SUSP: 0.5000 mL | Freq: Once | INTRAMUSCULAR | Status: DC

## 2017-10-04 MED ORDER — DIPHENHYDRAMINE HCL 50 MG/ML IJ SOLN: 12.5000 mg | INTRAMUSCULAR | Status: DC | PRN

## 2017-10-04 MED ORDER — LACTATED RINGERS IV SOLN
INTRAVENOUS | Status: DC
  Administered 2017-10-04: 02:00:00 via INTRAVENOUS
  Administered 2017-10-04: 125 mL/h via INTRAVENOUS

## 2017-10-04 MED ORDER — ONDANSETRON HCL 4 MG PO TABS: 4.0000 mg | ORAL_TABLET | ORAL | Status: DC | PRN

## 2017-10-04 MED ORDER — SIMETHICONE 80 MG PO CHEW
80.0000 mg | CHEWABLE_TABLET | ORAL | Status: DC | PRN
Start: 2017-10-04 — End: 2017-10-06

## 2017-10-04 MED ORDER — PRENATAL MULTIVITAMIN CH
1.0000 | ORAL_TABLET | Freq: Every day | ORAL | Status: DC
  Administered 2017-10-04 – 2017-10-06 (×3): 1 via ORAL
  Filled 2017-10-04 (×3): qty 1

## 2017-10-04 MED ORDER — LACTATED RINGERS IV SOLN: 500.0000 mL | Freq: Once | INTRAVENOUS | Status: DC

## 2017-10-04 MED ORDER — ZOLPIDEM TARTRATE 5 MG PO TABS
5.0000 mg | ORAL_TABLET | Freq: Every evening | ORAL | Status: DC | PRN
  Administered 2017-10-05: 5 mg via ORAL
  Filled 2017-10-04: qty 1

## 2017-10-04 MED ORDER — EPHEDRINE 5 MG/ML INJ
10.0000 mg | INTRAVENOUS | Status: DC | PRN
  Filled 2017-10-04: qty 2

## 2017-10-04 MED ORDER — ONDANSETRON HCL 4 MG/2ML IJ SOLN: 4.0000 mg | Freq: Four times a day (QID) | INTRAMUSCULAR | Status: DC | PRN

## 2017-10-04 MED ORDER — ACETAMINOPHEN 325 MG PO TABS
650.0000 mg | ORAL_TABLET | ORAL | Status: DC | PRN
  Administered 2017-10-04 – 2017-10-06 (×2): 650 mg via ORAL
  Filled 2017-10-04: qty 2

## 2017-10-04 MED ORDER — OXYCODONE HCL 5 MG PO TABS
10.0000 mg | ORAL_TABLET | Freq: Once | ORAL | Status: AC
  Administered 2017-10-04: 10 mg via ORAL
  Filled 2017-10-04: qty 2

## 2017-10-04 MED ORDER — WITCH HAZEL-GLYCERIN EX PADS: 1.0000 "application " | MEDICATED_PAD | CUTANEOUS | Status: DC | PRN

## 2017-10-04 MED ORDER — ONDANSETRON HCL 4 MG/2ML IJ SOLN: 4.0000 mg | INTRAMUSCULAR | Status: DC | PRN

## 2017-10-04 MED ORDER — OXYTOCIN 40 UNITS IN LACTATED RINGERS INFUSION - SIMPLE MED
1.0000 m[IU]/min | INTRAVENOUS | Status: DC
  Administered 2017-10-04: 2 m[IU]/min via INTRAVENOUS

## 2017-10-04 MED ORDER — ACETAMINOPHEN 325 MG PO TABS
650.0000 mg | ORAL_TABLET | ORAL | Status: DC | PRN
  Filled 2017-10-04: qty 2

## 2017-10-04 MED ORDER — DIBUCAINE 1 % RE OINT: 1.0000 "application " | TOPICAL_OINTMENT | RECTAL | Status: DC | PRN

## 2017-10-04 MED ORDER — MISOPROSTOL 25 MCG QUARTER TABLET
25.0000 ug | ORAL_TABLET | ORAL | Status: DC | PRN
  Administered 2017-10-04 (×2): 25 ug via VAGINAL
  Filled 2017-10-04 (×3): qty 1

## 2017-10-04 MED ORDER — LIDOCAINE HCL (PF) 1 % IJ SOLN
30.0000 mL | INTRAMUSCULAR | Status: DC | PRN
  Filled 2017-10-04: qty 30

## 2017-10-04 MED ORDER — FENTANYL 2.5 MCG/ML BUPIVACAINE 1/10 % EPIDURAL INFUSION (WH - ANES)
14.0000 mL/h | INTRAMUSCULAR | Status: DC | PRN
  Administered 2017-10-04: 14 mL/h via EPIDURAL
  Filled 2017-10-04: qty 100

## 2017-10-04 MED ORDER — OXYTOCIN 40 UNITS IN LACTATED RINGERS INFUSION - SIMPLE MED
1.0000 m[IU]/min | INTRAVENOUS | Status: DC
  Filled 2017-10-04: qty 1000

## 2017-10-04 MED ORDER — TERBUTALINE SULFATE 1 MG/ML IJ SOLN
0.2500 mg | Freq: Once | INTRAMUSCULAR | Status: DC | PRN
  Filled 2017-10-04: qty 1

## 2017-10-04 MED ORDER — FLEET ENEMA 7-19 GM/118ML RE ENEM: 1.0000 | ENEMA | RECTAL | Status: DC | PRN

## 2017-10-04 MED ORDER — OXYTOCIN BOLUS FROM INFUSION
500.0000 mL | Freq: Once | INTRAVENOUS | Status: AC
  Administered 2017-10-04: 500 mL via INTRAVENOUS

## 2017-10-04 MED ORDER — SOD CITRATE-CITRIC ACID 500-334 MG/5ML PO SOLN: 30.0000 mL | ORAL | Status: DC | PRN

## 2017-10-04 MED ORDER — DIPHENHYDRAMINE HCL 25 MG PO CAPS
25.0000 mg | ORAL_CAPSULE | Freq: Four times a day (QID) | ORAL | Status: DC | PRN
  Administered 2017-10-04 – 2017-10-05 (×3): 25 mg via ORAL
  Filled 2017-10-04 (×3): qty 1

## 2017-10-04 MED ORDER — COCONUT OIL OIL: 1.0000 "application " | TOPICAL_OIL | Status: DC | PRN

## 2017-10-04 MED ORDER — LIDOCAINE HCL (PF) 1 % IJ SOLN
INTRAMUSCULAR | Status: DC | PRN
  Administered 2017-10-04: 5 mL via EPIDURAL
  Administered 2017-10-04: 2 mL via EPIDURAL
  Administered 2017-10-04: 3 mL via EPIDURAL

## 2017-10-04 MED ORDER — IBUPROFEN 600 MG PO TABS
600.0000 mg | ORAL_TABLET | Freq: Four times a day (QID) | ORAL | Status: DC
  Administered 2017-10-04 – 2017-10-06 (×9): 600 mg via ORAL
  Filled 2017-10-04 (×9): qty 1

## 2017-10-04 MED ORDER — LACTATED RINGERS IV SOLN
500.0000 mL | INTRAVENOUS | Status: DC | PRN
  Administered 2017-10-04: 500 mL via INTRAVENOUS

## 2017-10-04 MED ORDER — BENZOCAINE-MENTHOL 20-0.5 % EX AERO: 1.0000 "application " | INHALATION_SPRAY | CUTANEOUS | Status: DC | PRN

## 2017-10-04 MED ORDER — FENTANYL CITRATE (PF) 100 MCG/2ML IJ SOLN
50.0000 ug | INTRAMUSCULAR | Status: DC | PRN
  Administered 2017-10-04: 100 ug via INTRAVENOUS
  Administered 2017-10-04: 50 ug via INTRAVENOUS
  Administered 2017-10-04: 100 ug via INTRAVENOUS
  Filled 2017-10-04 (×3): qty 2

## 2017-10-04 MED ORDER — PHENYLEPHRINE 40 MCG/ML (10ML) SYRINGE FOR IV PUSH (FOR BLOOD PRESSURE SUPPORT)
80.0000 ug | PREFILLED_SYRINGE | INTRAVENOUS | Status: DC | PRN
  Filled 2017-10-04: qty 5
  Filled 2017-10-04: qty 10

## 2017-10-04 MED ORDER — METHYLERGONOVINE MALEATE 0.2 MG PO TABS
0.2000 mg | ORAL_TABLET | ORAL | Status: AC
  Administered 2017-10-04 – 2017-10-05 (×6): 0.2 mg via ORAL
  Filled 2017-10-04 (×6): qty 1

## 2017-10-04 MED ORDER — SENNOSIDES-DOCUSATE SODIUM 8.6-50 MG PO TABS
2.0000 | ORAL_TABLET | ORAL | Status: DC
  Administered 2017-10-05 – 2017-10-06 (×2): 2 via ORAL
  Filled 2017-10-04 (×3): qty 2

## 2017-10-04 MED ORDER — DIPHENHYDRAMINE HCL 50 MG/ML IJ SOLN
12.5000 mg | Freq: Once | INTRAMUSCULAR | Status: AC
  Administered 2017-10-04: 12.5 mg via INTRAVENOUS
  Filled 2017-10-04: qty 1

## 2017-10-04 MED ORDER — OXYTOCIN 40 UNITS IN LACTATED RINGERS INFUSION - SIMPLE MED: 2.5000 [IU]/h | INTRAVENOUS | Status: DC

## 2017-10-04 MED ORDER — TRAMADOL HCL 50 MG PO TABS
50.0000 mg | ORAL_TABLET | Freq: Four times a day (QID) | ORAL | Status: DC | PRN
  Administered 2017-10-04 – 2017-10-05 (×4): 100 mg via ORAL
  Filled 2017-10-04 (×4): qty 2

## 2017-10-04 MED ORDER — PHENYLEPHRINE 40 MCG/ML (10ML) SYRINGE FOR IV PUSH (FOR BLOOD PRESSURE SUPPORT)
80.0000 ug | PREFILLED_SYRINGE | INTRAVENOUS | Status: DC | PRN
  Filled 2017-10-04: qty 5

## 2017-10-04 NOTE — Progress Notes (Signed)
Patient trickled upon admission assessment on mother baby. Uterus firm at the umbilicus. Encouraged patient to call if she felt increased bleeding. Patient called just before her one hour assessment stating she was bleeding. Patient was deviated to the left and two above the umbilicus. Large amount of bleeding noted. Assisted patient to bathroom. 50 mL clot fell to the floor and three clots approximately the same size fell in toilet. Patient voided a large amount in toilet and ambulated well with no dizziness. Bed changed and EBL measured to approximately 513 mL total. Patient in pain, given ibuprofen one hour prior and tylenol given after bleeding episode. Upon assessment one hour later, patient deviated again and voided again. Another 50 mL blood clot in pad but then firm and one below with small amount of bleeding. Patient states tylenol and ibuprofen did not help with pain. Called NorwoodLori Clemmons, CNM to notify of bleeding and pain. Lawson FiscalLori ordered a one time dose of oxycodone (one -two tablets). Patient requested two tablets due to pain. Lawson FiscalLori also stated to continue to monitor and if patient has another episode of clots to start methergine series. Will continue to monitor closely. Earl Galasborne, Linda HedgesStefanie SpoonerHudspeth

## 2017-10-04 NOTE — Progress Notes (Signed)
Gary FleetJocelyn J Hidalgo MRN: [id number]  Subjective: -Patient requests to speak to provider. Nurse reports patient states cytotec expelled after placement.  In room to assess. Patient expresses concerns with nursing practice and requests change in nursing care.  Also reports some abdominal itching r/t gel from transducers.   Objective: BP 114/65   Pulse 80   Temp 98 F (36.7 C) (Oral)   Resp 16   Ht 5\' 7"  (1.702 m)   Wt 69.1 kg (152 lb 4 oz)   LMP 01/04/2017   BMI 23.85 kg/m  No intake/output data recorded. No intake/output data recorded.  Fetal Monitoring: FHT: 135 bpm, Mod Var, -Decels, +Accels UC: Occasional    Vaginal Exam: SVE:   Dilation: 1 Effacement (%): Thick Exam by:: Gerrit HeckJessica Doneen Ollinger, CNM Membranes:Intact Internal Monitors: None  Augmentation/Induction: Pitocin:None Cytotec: 1st Dose Placed  Assessment:  IUP at 40wks Cat I FT  IOL Bishop Score: 4  Plan: -Cytotec placed in posterior fornix by provider -Discussed feelings regarding nursing care and charge nurse notified of patient's desire for new nurse-CN verbalizes understanding -IV benadryl for abdominal itching -Encouraged rest -Continue other mgmt as ordered  Valma CavaJessica L Remedios Mckone,MSN, CNM 10/04/2017, 3:00 AM

## 2017-10-04 NOTE — Anesthesia Pain Management Evaluation Note (Signed)
  [facility] Pain Management Visit Note  Patient: Jaime Leonard, 27 y.o., female  "Hello I am a [facility] of the anesthesia [facility]. We have an anesthesia team available at all times to provide care throughout the hospital, including epidural management and anesthesia for C-section. I don't know your plan for the delivery whether it a natural birth, water birth, IV sedation, nitrous supplementation, doula or epidural, but we want to meet your pain goals."   1.Was your pain managed to your expectations on prior hospitalizations?   Yes   2.What is your expectation for pain management during this hospitalization?     Epidural  3.How can we help you reach that goal? unsure  Record the patient's initial score and the patient's pain goal.   Pain: 7  Pain Goal: 9 [facility] wants you to be able to say your pain was always managed very well.  Cephus ShellingBURGER,Bayler Nehring 10/04/2017

## 2017-10-04 NOTE — Anesthesia Postprocedure Evaluation (Signed)
Anesthesia Post Note  Patient: Gary FleetJocelyn J Daher  Procedure(s) Performed: AN AD HOC LABOR EPIDURAL     Patient location during evaluation: Mother Baby Anesthesia Type: Epidural Level of consciousness: awake Pain management: satisfactory to patient Vital Signs Assessment: post-procedure vital signs reviewed and stable Respiratory status: spontaneous breathing Cardiovascular status: stable Anesthetic complications: no    Last Vitals:  Vitals:   10/04/17 1250 10/04/17 1349  BP: 121/65 123/68  Pulse: (!) 56 64  Resp: 16 18  Temp: 36.8 C 36.9 C  SpO2: 100% 100%    Last Pain:  Vitals:   10/04/17 1354  TempSrc:   PainSc: 8    Pain Goal: Patients Stated Pain Goal: 2 (10/04/17 1354)               Cephus ShellingBURGER,Amante Fomby

## 2017-10-04 NOTE — Progress Notes (Signed)
Patient checked and trickling again. Fundus deviated. Assisted patient to the bathroom to void again. Patient voided and had a grape sized clot in pads. Passed three more clots in toilet per patient. After voiding, uterus firm and one below. Bleeding scant with no trickling. Placed order for methergine series per Illene BolusLori Clemmons', CNM previous order. Encouraged patient to continue to void at least every hour to keep bladder empty. Discussed methergine with patient. Will continue to monitor. Earl Galasborne, Linda HedgesStefanie Griffith CreekHudspeth

## 2017-10-04 NOTE — Anesthesia Preprocedure Evaluation (Signed)
Anesthesia Evaluation  Patient identified by MRN, date of birth, ID band Patient awake    Reviewed: Allergy & Precautions, NPO status , Patient's Chart, lab work & pertinent test results  Airway Mallampati: II  TM Distance: >3 FB Neck ROM: Full    Dental  (+) Teeth Intact, Dental Advisory Given, Missing   Pulmonary neg pulmonary ROS,    Pulmonary exam normal breath sounds clear to auscultation       Cardiovascular Exercise Tolerance: Good negative cardio ROS Normal cardiovascular exam Rhythm:Regular Rate:Normal     Neuro/Psych PSYCHIATRIC DISORDERS Anxiety Bipolar Disorder negative neurological ROS     GI/Hepatic negative GI ROS, Neg liver ROS,   Endo/Other  negative endocrine ROS  Renal/GU negative Renal ROS     Musculoskeletal negative musculoskeletal ROS (+)   Abdominal   Peds  Hematology  (+) Blood dyscrasia, anemia , Plt 251k   Anesthesia Other Findings Day of surgery medications reviewed with the patient.  Reproductive/Obstetrics (+) Pregnancy                             Anesthesia Physical Anesthesia Plan  ASA: II  Anesthesia Plan: Epidural   Post-op Pain Management:    Induction:   PONV Risk Score and Plan: 2 and Treatment may vary due to age or medical condition  Airway Management Planned:   Additional Equipment:   Intra-op Plan:   Post-operative Plan:   Informed Consent: I have reviewed the patients History and Physical, chart, labs and discussed the procedure including the risks, benefits and alternatives for the proposed anesthesia with the patient or authorized representative who has indicated his/her understanding and acceptance.   Dental advisory given  Plan Discussed with:   Anesthesia Plan Comments: (Patient identified. Risks/Benefits/Options discussed with patient including but not limited to bleeding, infection, nerve damage, paralysis, failed block,  incomplete pain control, headache, blood pressure changes, nausea, vomiting, reactions to medication both or allergic, itching and postpartum back pain. Confirmed with bedside nurse the patient's most recent platelet count. Confirmed with patient that they are not currently taking any anticoagulation, have any bleeding history or any family history of bleeding disorders. Patient expressed understanding and wished to proceed. All questions were answered. )        Anesthesia Quick Evaluation

## 2017-10-04 NOTE — Anesthesia Procedure Notes (Signed)
Epidural Patient location during procedure: OB Start time: 10/04/2017 9:30 AM End time: 10/04/2017 9:35 AM  Staffing Anesthesiologist: Cecile Hearingurk, Aleenah Homen Edward, MD Performed: anesthesiologist   Preanesthetic Checklist Completed: patient identified, pre-op evaluation, timeout performed, IV checked, risks and benefits discussed and monitors and equipment checked  Epidural Patient position: sitting Prep: DuraPrep Patient monitoring: blood pressure and continuous pulse ox Approach: midline Location: L3-L4 Injection technique: LOR air  Needle:  Needle type: Tuohy  Needle gauge: 17 G Needle length: 9 cm Needle insertion depth: 4 cm Catheter size: 19 Gauge Catheter at skin depth: 9 cm Test dose: negative and Other (1% Lidocaine)  Additional Notes Patient identified.  Risk benefits discussed including failed block, incomplete pain control, headache, nerve damage, paralysis, blood pressure changes, nausea, vomiting, reactions to medication both toxic or allergic, and postpartum back pain.  Patient expressed understanding and wished to proceed.  All questions were answered.  Sterile technique used throughout procedure and epidural site dressed with sterile barrier dressing. No paresthesia or other complications noted. The patient did not experience any signs of intravascular injection such as tinnitus or metallic taste in mouth nor signs of intrathecal spread such as rapid motor block. Please see nursing notes for vital signs. Reason for block:procedure for pain
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
## 2017-10-05 NOTE — Plan of Care (Signed)
  Activity: Will verbalize the importance of balancing activity with adequate rest periods 10/05/2017 0953 by Karn Cassissborne, Takisha Pelle H, RN Note Patient ambulating frequently today with no dizziness. Discussed the importance of increased fluid intake and ambulation.

## 2017-10-05 NOTE — Lactation Note (Addendum)
This note was copied from a baby's chart. Lactation Consultation Note  Patient Name: Jaime Deberah PeltonJocelyn Rohr [id number]'XToday's Date: 10/05/2017 Reason for consult: Follow-up assessment   P3, Baby 25 hours old and latched upon entering. Mother states she only breastfed her last child for 9 mos and not her first. Pecola LeisureBaby came on and off breast during feeding.  Encouraged depth. Nipples evert and compressible, able to hand express. Baby has been sleepy so suggested undressing baby and placing STS for feedings. Provided mother w/ manual pump. Mom encouraged to feed baby 8-12 times/24 hours and with feeding cues.  Mom made aware of O/P services, breastfeeding support groups, community resources, and our phone # for post-discharge questions.       Maternal Data Has patient been taught Hand Expression?: Yes Does the patient have breastfeeding experience prior to this delivery?: Yes  Feeding Feeding Type: Breast Fed Length of feed: 25 min  LATCH Score Latch: Grasps breast easily, tongue down, lips flanged, rhythmical sucking.  Audible Swallowing: A few with stimulation  Type of Nipple: Everted at rest and after stimulation  Comfort (Breast/Nipple): Soft / non-tender  Hold (Positioning): Assistance needed to correctly position infant at breast and maintain latch.  LATCH Score: 8  Interventions Interventions: Breast feeding basics reviewed  Lactation Tools Discussed/Used     Consult Status Consult Status: Follow-up Date: 10/06/17 Follow-up type: In-patient    Dahlia ByesBerkelhammer, Jaime Leonard 10/05/2017, 1:46 PM

## 2017-10-05 NOTE — Discharge Summary (Signed)
OB Discharge Summary     Patient Name: Jaime Leonard DOB: May 24, 1991 MRN: [id number]  Date of admission: 10/04/2017 Delivering MD: Illene BolusLEMMONS, LORI A   Date of discharge: 10/06/2017  Admitting diagnosis: 40 WK INDUCTION Intrauterine pregnancy: 339w0d     Secondary diagnosis:  Principal Problem:   Vaginal delivery Active Problems:   Shellfish allergy   Bipolar disorder, unspecified--dx in 8th grade, no recent or current meds/treatment   Velamentous insertion of umbilical cord in third trimester  Additional problems: NA     Discharge diagnosis: Term Pregnancy Delivered                                                                                                Post partum procedures:NA  Augmentation: AROM, Pitocin and Cytotec  Complications: None  Hospital course:  Induction of Labor With Vaginal Delivery   27 y.o. yo [id number] at 7739w0d was admitted to the hospital 10/04/2017 for induction of labor.  Indication for induction: Velamentous cord insertion.  Patient had an uncomplicated labor course as follows: Membrane Rupture Time/Date: 10:47 AM ,10/04/2017   Intrapartum Procedures: Episiotomy: None [1]                                         Lacerations:  None [1]  Patient had delivery of a Viable infant.  Information for the patient's newborn:  Jaime Leonard, Girl Bronte [[id number]][[id number]]  Delivery Method: Vaginal, Spontaneous(Filed from Delivery Summary)   10/04/2017  Details of delivery can be found in separate delivery note.  Patient had a routine postpartum course. Patient is discharged home 10/06/17.  Patient received best pain management from Toradol and Ibuprophen--home with Rx for both.  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  Ice pack application, wrist brace use recommended.  F/u on this at 2 week pp visit.  She is undecided regarding contraception--written information given for patient  review.  Physical exam  Vitals:   10/05/17 1141 10/05/17 1758 10/05/17 2340 10/06/17 0543  BP:  (!) 111/59 118/60 (!) 116/53  Pulse:  68 69   Resp:  17 18 17   Temp:  97.7 F (36.5 C) 98.6 F (37 C) 97.6 F (36.4 C)  TempSrc:  Oral Oral Oral  SpO2: 100%   97%  Weight:      Height:       General: alert Lochia: appropriate Uterine Fundus: firm Incision: Perineum intact DVT Evaluation: No evidence of DVT seen on physical exam. Labs: CBC Latest Ref Rng & Units 10/06/2017 10/04/2017 02/18/2017  WBC 4.0 - 10.5 K/uL 10.4 11.3(H) 11.2(H)  Hemoglobin 12.0 - 15.0 g/dL 10.0(L) 10.7(L) 12.2  Hematocrit 36.0 - 46.0 % 29.4(L) 31.2(L) 35.3(L)  Platelets 150 - 400 K/uL 235 251 219   No flowsheet data found.  Discharge instruction: per After Visit Summary and "Baby and Me Booklet".  After visit meds:  Allergies as of 10/06/2017      Reactions   Penicillins  Hives   Has patient had a PCN reaction causing immediate rash, facial/tongue/throat swelling, SOB or lightheadedness with hypotension: No Has patient had a PCN reaction causing severe rash involving mucus membranes or skin necrosis: No Has patient had a PCN reaction that required hospitalization: No Has patient had a PCN reaction occurring within the last 10 years: Yes If all of the above answers are "NO", then may proceed with Cephalosporin use.   Shellfish Allergy Hives      Medication List    TAKE these medications   diphenhydramine-acetaminophen 25-500 MG Tabs tablet Commonly known as:  TYLENOL PM Take 2 tablets by mouth at bedtime as needed (For pain.).   ibuprofen 600 MG tablet Commonly known as:  ADVIL,MOTRIN Take 1 tablet (600 mg total) by mouth every 6 (six) hours as needed.   prenatal multivitamin Tabs tablet Take 1 tablet by mouth daily at 12 noon.   traMADol 50 MG tablet Commonly known as:  ULTRAM Take 1-2 tablets (50-100 mg total) by mouth every 6 (six) hours as needed for moderate pain.   triamcinolone ointment  0.1 % Commonly known as:  KENALOG Apply 1 application topically 2 (two) times daily.       Diet: routine diet  Activity: Advance as tolerated. Pelvic rest for 6 weeks.   Outpatient follow up:2 weeks, then 6 weeks Follow up Appt:No future appointments. Follow up Visit:No Follow-up on file.  Postpartum contraception: Undecided  Newborn Data: Live born female  Birth Weight: 7 lb 14.6 oz (3590 g) APGAR: 9, 10  Newborn Delivery   Time head delivered:  10/04/2017 10:50:00 Birth date/time:  10/04/2017 10:51:00 Delivery type:  Vaginal, Spontaneous     Baby Feeding: Breast Disposition:home with mother   10/06/2017 Nigel Bridgeman, CNM

## 2017-10-05 NOTE — Progress Notes (Signed)
Jaime Leonard  Post Partum Day 1:S/P SVD   Subjective: Patient up ad lib, denies syncope or dizziness. Reports consuming regular diet without issues and denies N/V. Denies issues with urination and reports bleeding is "better."  Patient is breastfeeding and reports going good.  Desires postpartum contraception, but is unsure of method.  Pain is being managed with use of tramadol.  Objective: Vitals:   10/04/17 1250 10/04/17 1349 10/04/17 1815 10/05/17 0425  BP: 121/65 123/68 125/68 128/70  Pulse: (!) 56 64 65 66  Resp: 16 18 16 18   Temp: 98.2 F (36.8 C) 98.4 F (36.9 C) 98.4 F (36.9 C) 98.4 F (36.9 C)  TempSrc: Oral Oral Oral Oral  SpO2: 100% 100% 100%   Weight:      Height:       Recent Labs    10/04/17 0116  HGB 10.7*  HCT 31.2*    Physical Exam:  General: alert, cooperative and no distress Mood/Affect: Appropriate/Bright Lungs: clear to auscultation, no wheezes, rales or rhonchi, symmetric air entry.  Heart: normal rate and regular rhythm. Breast: breasts appear normal, no suspicious masses, no skin or nipple changes or axillary nodes. Abdomen:  + bowel sounds, Soft, NT Uterine Fundus: firm, U/-1 Lochia: appropriate Laceration: None Skin: Warm, Dry DVT Evaluation: No evidence of DVT seen on physical exam. No significant calf/ankle edema.  Assessment S/P Vaginal Delivery-Day 1 Normal Involution BreastFeeding Nausea  Plan: Continue methergine for bleeding Antiemetics for nausea as appropriate Discussed BC Methods Continue current care Plan for discharge tomorrow Dr. Katharine LookJ.Ozan to be updated on patient status   Cherre RobinsJessica L Runell Kovich, MSN, CNM 10/05/2017, 6:31 AM

## 2017-10-06 LAB — CBC
HEMATOCRIT: 29.4 % — AB (ref 36.0–46.0)
Hemoglobin: 10 g/dL — ABNORMAL LOW (ref 12.0–15.0)
MCH: 28.7 pg (ref 26.0–34.0)
MCHC: 34 g/dL (ref 30.0–36.0)
MCV: 84.2 fL (ref 78.0–100.0)
PLATELETS: 235 10*3/uL (ref 150–400)
RBC: 3.49 MIL/uL — ABNORMAL LOW (ref 3.87–5.11)
RDW: 13.2 % (ref 11.5–15.5)
WBC: 10.4 10*3/uL (ref 4.0–10.5)

## 2017-10-06 MED ORDER — TRAMADOL HCL 50 MG PO TABS: 50.0000 mg | ORAL_TABLET | Freq: Four times a day (QID) | ORAL | 1 refills | Status: DC | PRN

## 2017-10-06 MED ORDER — IBUPROFEN 600 MG PO TABS: 600.0000 mg | ORAL_TABLET | Freq: Four times a day (QID) | ORAL | 2 refills | Status: DC | PRN

## 2017-10-06 NOTE — Lactation Note (Signed)
This note was copied from a baby's chart. Lactation Consultation Note  Patient Name: Girl Deberah PeltonJocelyn Berke NWGNF'AToday's Date: 10/06/2017 Reason for consult: Follow-up assessment Breasts full this AM but not engorged.  Mom states baby ate 3 hours ago but won't wake to feed.  Undressed baby to diaper and she started showing feeding cues.  Baby latched easily and well to breast.  Active sucking/swallowing noted.  Reviewed waking techniques and breast massage with mom.  Questions answered.  Reviewed manual pump.  Lactation outpatient services and support reviewed and encouraged prn.  Maternal Data    Feeding Feeding Type: Breast Fed  LATCH Score Latch: Grasps breast easily, tongue down, lips flanged, rhythmical sucking.  Audible Swallowing: Spontaneous and intermittent  Type of Nipple: Everted at rest and after stimulation  Comfort (Breast/Nipple): Soft / non-tender  Hold (Positioning): No assistance needed to correctly position infant at breast.  LATCH Score: 10  Interventions Interventions: Breast feeding basics reviewed;Assisted with latch  Lactation Tools Discussed/Used     Consult Status Consult Status: Complete    Huston FoleyMOULDEN, Elecia Serafin S 10/06/2017, 9:50 AM

## 2017-10-06 NOTE — Discharge Instructions (Signed)
Contraception Choices Contraception, also called birth control, means things to use or ways to try not to get pregnant.  Some are OK with breast feeding, some are not used when breastfeeding. Hormonal birth control This kind of birth control uses hormones. Here are some types of hormonal birth control:  A tube that is put under skin of the arm (implant). The tube can stay in for as long as 3 years.  OK FOR BREASTFEEDING  Shots to get every 3 months (injections).  OK FOR BREASTFEEDING  Pills to take every day (birth control pills).  ONE PILL ("MINI PILL") OK FOR BREASTFEEDING  A patch to change 1 time each week for 3 weeks (birth control patch). After that, the patch is taken off for 1 week.  NOT USED WITH BREASTFEEDING.  A ring to put in the vagina. The ring is left in for 3 weeks. Then it is taken out of the vagina for 1 week. Then a new ring is put in.  NOT USED WITH BREASTFEEDING.  Pills to take after unprotected sex (emergency birth control pills).  OK WITH BREASTFEEDING, BUT NOT TO BE USED FOR ROUTINE BIRTH CONTROL.  Barrier birth control Here are some types of barrier birth control:  A thin covering that is put on the penis before sex (female condom). The covering is thrown away after sex.  A soft, loose covering that is put in the vagina before sex (female condom). The covering is thrown away after sex.  A rubber bowl that sits over the cervix (diaphragm). The bowl must be made for you. The bowl is put into the vagina before sex. The bowl is left in for 6-8 hours after sex. It is taken out within 24 hours.  A small, soft cup that fits over the cervix (cervical cap). The cup must be made for you. The cup can be left in for 6-8 hours after sex. It is taken out within 48 hours.  A sponge that is put into the vagina before sex. It must be left in for at least 6 hours after sex. It must be taken out within 30 hours. Then it is thrown away.  A chemical that kills or stops sperm from  getting into the uterus (spermicide). It may be a pill, cream, jelly, or foam to put in the vagina. The chemical should be used at least 10-15 minutes before sex.  IUD (intrauterine) birth control An IUD is a small, T-shaped piece of plastic. It is put inside the uterus. There are two kinds:  Hormone IUD. This kind can stay in for 3-5 years.  Copper IUD. This kind can stay in for 10 years.  Permanent birth control Here are some types of permanent birth control:  Surgery to block the fallopian tubes.  Having an insert put into each fallopian tube.  Surgery to tie off the tubes that carry sperm (vasectomy).  Natural planning birth control Here are some types of natural planning birth control:  Not having sex on the days the woman could get pregnant.  Using a calendar: ? To keep track of the length of each period. ? To find out what days pregnancy can happen. ? To plan to not have sex on days when pregnancy can happen.  Watching for symptoms of ovulation and not having sex during ovulation. One way the woman can check for ovulation is to check her temperature.  Waiting to have sex until after ovulation.  Summary  Contraception, also called birth control, means things  to use or ways to try not to get pregnant.  Hormonal methods of birth control include implants, injections, pills, patches, vaginal rings, and emergency birth control pills.  Barrier methods of birth control can include female condoms, female condoms, diaphragms, cervical caps, sponges, and spermicides.  There are two types of IUD (intrauterine device) birth control. An IUD can be put in a woman's uterus to prevent pregnancy for 3-5 years.  Permanent sterilization can be done through a procedure for males, females, or both.  Natural planning methods involve not having sex on the days when the woman could get pregnant. This information is not intended to replace advice given to you by your health care provider.  Make sure you discuss any questions you have with your health care provider. Document Released: 06/16/2009 Document Revised: 08/29/2016 Document Reviewed: 08/29/2016 Elsevier Interactive Patient Education  2017 [employer]   Postpartum Care After Vaginal Delivery The period of time right after you deliver your newborn is called the postpartum period. What kind of medical care will I receive?  You may continue to receive fluids and medicines through an IV tube inserted into one of your veins.  If an incision was made near your vagina (episiotomy) or if you had some vaginal tearing during delivery, cold compresses may be placed on your episiotomy or your tear. This helps to reduce pain and swelling.  You may be given a squirt bottle to use when you go to the bathroom. You may use this until you are comfortable wiping as usual. To use the squirt bottle, follow these steps: ? Before you urinate, fill the squirt bottle with warm water. Do not use hot water. ? After you urinate, while you are sitting on the toilet, use the squirt bottle to rinse the area around your urethra and vaginal opening. This rinses away any urine and blood. ? You may do this instead of wiping. As you start healing, you may use the squirt bottle before wiping yourself. Make sure to wipe gently. ? Fill the squirt bottle with clean water every time you use the bathroom.  You will be given sanitary pads to wear. How can I expect to feel?  You may not feel the need to urinate for several hours after delivery.  You will have some soreness and pain in your abdomen and vagina.  If you are breastfeeding, you may have uterine contractions every time you breastfeed for up to several weeks postpartum. Uterine contractions help your uterus return to its normal size.  It is normal to have vaginal bleeding (lochia) after delivery. The amount and appearance of lochia is often similar to a menstrual period in the first week after  delivery. It will gradually decrease over the next few weeks to a dry, yellow-brown discharge. For most women, lochia stops completely by 6-8 weeks after delivery. Vaginal bleeding can vary from woman to woman.  Within the first few days after delivery, you may have breast engorgement. This is when your breasts feel heavy, full, and uncomfortable. Your breasts may also throb and feel hard, tightly stretched, warm, and tender. After this occurs, you may have milk leaking from your breasts.Your health care provider can help you relieve discomfort due to breast engorgement. Breast engorgement should go away within a few days.  You may feel more sad or worried than normal due to hormonal changes after delivery. These feelings should not last more than a few days. If these feelings do not go away after several days, speak  with your health care provider. How should I care for myself?  Tell your health care provider if you have pain or discomfort.  Drink enough water to keep your urine clear or pale yellow.  Wash your hands thoroughly with soap and water for at least 20 seconds after changing your sanitary pads, after using the toilet, and before holding or feeding your baby.  If you are not breastfeeding, avoid touching your breasts a lot. Doing this can make your breasts produce more milk.  If you become weak or lightheaded, or you feel like you might faint, ask for help before: ? Getting out of bed. ? Showering.  Change your sanitary pads frequently. Watch for any changes in your flow, such as a sudden increase in volume, a change in color, the passing of large blood clots. If you pass a blood clot from your vagina, save it to show to your health care provider. Do not flush blood clots down the toilet without having your health care provider look at them.  Make sure that all your vaccinations are up to date. This can help protect you and your baby from getting certain diseases. You may need to have  immunizations done before you leave the hospital.  If desired, talk with your health care provider about methods of family planning or birth control (contraception). How can I start bonding with my baby? Spending as much time as possible with your baby is very important. During this time, you and your baby can get to know each other and develop a bond. Having your baby stay with you in your room (rooming in) can give you time to get to know your baby. Rooming in can also help you become comfortable caring for your baby. Breastfeeding can also help you bond with your baby. How can I plan for returning home with my baby?  Make sure that you have a car seat installed in your vehicle. ? Your car seat should be checked by a certified car seat installer to make sure that it is installed safely. ? Make sure that your baby fits into the car seat safely.  Ask your health care provider any questions you have about caring for yourself or your baby. Make sure that you are able to contact your health care provider with any questions after leaving the hospital. This information is not intended to replace advice given to you by your health care provider. Make sure you discuss any questions you have with your health care provider. Document Released: 06/16/2007 Document Revised: 01/22/2016 Document Reviewed: 07/24/2015 Elsevier Interactive Patient Education  [employer]2018 Else[employer] Inc.

## 2018-04-22 ENCOUNTER — Encounter (HOSPITAL_COMMUNITY): Payer: Self-pay

## 2018-04-22 ENCOUNTER — Ambulatory Visit (HOSPITAL_COMMUNITY)
Admission: EM | Admit: 2018-04-22 | Discharge: 2018-04-22 | Disposition: A | Discharge status: Home / Self Care | Payer: Medicaid Other | Attending: Family Medicine | Admitting: Family Medicine

## 2018-04-22 ENCOUNTER — Other Ambulatory Visit: Payer: Self-pay

## 2018-04-22 DIAGNOSIS — N898 Other specified noninflammatory disorders of vagina: Secondary | ICD-10-CM

## 2018-04-22 MED ORDER — FLUCONAZOLE 150 MG PO TABS: 150.0000 mg | ORAL_TABLET | Freq: Every day | ORAL | 0 refills | Status: DC

## 2018-04-22 MED ORDER — CLINDAMYCIN PHOSPHATE 2 % VA CREA: 1.0000 | TOPICAL_CREAM | Freq: Every day | VAGINAL | 0 refills | Status: DC
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
## 2018-04-22 NOTE — ED Triage Notes (Signed)
Pt thinks she has BV.

## 2018-04-22 NOTE — Discharge Instructions (Signed)
Start clindamycin vaginal cream as directed.  Diflucan as directed to cover for yeast.  Avoid new hygiene products, avoid soap for now.  Sitz bath as needed.  Follow-up with your OB/GYN for further evaluation if continues with symptoms. Refrain from sexual activity for the next 7 days. Monitor for any worsening of symptoms, fever, abdominal pain, nausea, vomiting, to follow up for reevaluation.

## 2019-05-29 ENCOUNTER — Emergency Department (HOSPITAL_COMMUNITY)
Admission: EM | Admit: 2019-05-29 | Discharge: 2019-05-29 | Disposition: A | Discharge status: Home / Self Care | Payer: Self-pay | Attending: Emergency Medicine | Admitting: Emergency Medicine

## 2019-05-29 ENCOUNTER — Other Ambulatory Visit: Payer: Self-pay

## 2019-05-29 ENCOUNTER — Emergency Department (HOSPITAL_COMMUNITY): Payer: Self-pay

## 2019-05-29 DIAGNOSIS — S0990XA Unspecified injury of head, initial encounter: Secondary | ICD-10-CM

## 2019-05-29 DIAGNOSIS — Y929 Unspecified place or not applicable: Secondary | ICD-10-CM | POA: Insufficient documentation

## 2019-05-29 DIAGNOSIS — Y939 Activity, unspecified: Secondary | ICD-10-CM | POA: Insufficient documentation

## 2019-05-29 DIAGNOSIS — S0081XA Abrasion of other part of head, initial encounter: Secondary | ICD-10-CM | POA: Insufficient documentation

## 2019-05-29 DIAGNOSIS — Y999 Unspecified external cause status: Secondary | ICD-10-CM | POA: Insufficient documentation

## 2019-05-29 MED ORDER — TETANUS-DIPHTH-ACELL PERTUSSIS 5-2.5-18.5 LF-MCG/0.5 IM SUSP: 0.5000 mL | Freq: Once | INTRAMUSCULAR | Status: DC

## 2019-05-29 NOTE — ED Notes (Signed)
Patient verbalizes understanding of discharge instructions. Opportunity for questioning and answers were provided. Armband removed by staff, pt discharged from ED.  
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
## 2019-05-29 NOTE — ED Notes (Signed)
Pt refused all meds and other testing.

## 2021-12-26 ENCOUNTER — Ambulatory Visit: Payer: Self-pay | Admitting: *Deleted

## 2021-12-26 NOTE — Telephone Encounter (Signed)
Reason for Disposition ? [1] MODERATE pain (e.g., interferes with normal activities) AND [2] present > 3 days ? ?Answer Assessment - Initial Assessment Questions ?1. ONSET: "When did the pain start?" ?    3 months ?2. LOCATION: "Where is the pain located?" ?    L under arm ?3. PAIN: "How bad is the pain?" (Scale 1-10; or mild, moderate, severe) ?  - MILD (1-3): doesn't interfere with normal activities ?  - MODERATE (4-7): interferes with normal activities (e.g., work or school) or awakens from sleep ?  - SEVERE (8-10): excruciating pain, unable to do any normal activities, unable to hold a cup of water ?    Mild- burning sensation  ?4. WORK OR EXERCISE: "Has there been any recent work or exercise that involved this part of the body?" ?    no ?5. CAUSE: "What do you think is causing the arm pain?" ?    unsure ?6. OTHER SYMPTOMS: "Do you have any other symptoms?" (e.g., neck pain, swelling, rash, fever, numbness, weakness) ?    Weight loss, hair changing ?7. PREGNANCY: "Is there any chance you are pregnant?" "When was your last menstrual period?" ?    No- LMP- 4/20 ? ?Protocols used: Arm Pain-A-AH ? ?

## 2021-12-26 NOTE — Telephone Encounter (Signed)
?  Chief Complaint: L under arm- burning sensation ?Symptoms: burning sensation, pain- under arm ?Frequency: 3 months ?Pertinent Negatives: Patient denies neck pain, swelling, rash, fever, numbness, weakness ?Disposition: [] ED /[] Urgent Care (no appt availability in office) / [] Appointment(In office/virtual)/ []  [facility]/ [] Home Care/ [] Refused Recommended Disposition /[x] [facility]/ []  Follow-up with PCP ?Additional Notes:    ?

## 2021-12-31 ENCOUNTER — Ambulatory Visit: Payer: Self-pay | Admitting: Physician Assistant

## 2022-05-03 ENCOUNTER — Ambulatory Visit
Admission: RE | Admit: 2022-05-03 | Discharge: 2022-05-03 | Disposition: A | Discharge status: Home / Self Care | Payer: Medicaid Other | Source: Ambulatory Visit | Attending: Urgent Care | Admitting: Urgent Care

## 2022-05-03 VITALS — BP 113/67 | HR 73 | Temp 98.5°F | Resp 16

## 2022-05-03 DIAGNOSIS — N760 Acute vaginitis: Secondary | ICD-10-CM

## 2022-05-03 DIAGNOSIS — B3731 Acute candidiasis of vulva and vagina: Secondary | ICD-10-CM | POA: Diagnosis not present

## 2022-05-03 DIAGNOSIS — N898 Other specified noninflammatory disorders of vagina: Secondary | ICD-10-CM

## 2022-05-03 MED ORDER — FLUCONAZOLE 150 MG PO TABS: 150.0000 mg | ORAL_TABLET | ORAL | 0 refills | Status: DC

## 2022-05-03 MED ORDER — CLINDAMYCIN HCL 300 MG PO CAPS: 300.0000 mg | ORAL_CAPSULE | Freq: Two times a day (BID) | ORAL | 0 refills | Status: DC

## 2022-05-03 NOTE — ED Triage Notes (Addendum)
Pt. States she Is feeling vaginal itching and seeing white bumps. Pt. States it happened a few days after switching soaps and is concerned.

## 2022-05-03 NOTE — ED Provider Notes (Signed)
[facility]  Note:  This document was prepared using Conservation officer, historic buildings and may include unintentional dictation errors.  MRN: [id number] DOB: 1990/12/09  Subjective:   Jaime Leonard is a 31 y.o. female presenting for several day history of acute onset thick white vaginal discharge and white patches over the inside of her labia minora per patient.  Symptoms started after she switched soaps.  States that she has a history of yeast infections and BV infections.  She has previously also had trichomonas but this was sometime ago.  Does not have concerns for genital herpes.  Does not want a pelvic exam.  No chronic medications.    Allergies  Allergen Reactions   Penicillins Hives    Has patient had a PCN reaction causing immediate rash, facial/tongue/throat swelling, SOB or lightheadedness with hypotension: No Has patient had a PCN reaction causing severe rash involving mucus membranes or skin necrosis: No Has patient had a PCN reaction that required hospitalization: No Has patient had a PCN reaction occurring within the last 10 years: Yes If all of the above answers are "NO", then may proceed with Cephalosporin use.    Shellfish Allergy Hives    Past Medical History:  Diagnosis Date   History of chicken pox    Ovarian cyst    Trichomonas contact, treated    Vitamin D deficiency    Yeast infection      Past Surgical History:  Procedure Laterality Date   DILATION AND EVACUATION N/A 05/10/2013   Procedure: DILATATION AND EVACUATION;  Surgeon: Purcell Nails, MD;  Location: [facility];  Service: Gynecology;  Laterality: N/A;   INDUCED ABORTION  06/05/2012   elective   WISDOM TOOTH EXTRACTION      Family History  Problem Relation Age of Onset   Hypertension Mother    Cancer Mother    Hypertension Maternal Grandmother    Diabetes Maternal Grandmother    Kidney failure Maternal Grandmother    Stroke Maternal Grandmother    Diabetes Paternal  Grandmother    Diabetes Father     Social History   Tobacco Use   Smoking status: Never   Smokeless tobacco: Never  Substance Use Topics   Alcohol use: No    Alcohol/week: 1.0 standard drink of alcohol    Types: 1 Standard drinks or equivalent per week    Comment: rare   Drug use: No    ROS   Objective:   Vitals: BP 113/67   Pulse 73   Temp 98.5 F (36.9 C)   Resp 16   LMP 04/16/2022 (Approximate) Comment: CYCLE CAME TWICE  SpO2 96%   Physical Exam Constitutional:      General: She is not in acute distress.    Appearance: Normal appearance. She is well-developed. She is not ill-appearing, toxic-appearing or diaphoretic.  HENT:     Head: Normocephalic and atraumatic.     Nose: Nose normal.     Mouth/Throat:     Mouth: Mucous membranes are moist.  Eyes:     General: No scleral icterus.       Right eye: No discharge.        Left eye: No discharge.     Extraocular Movements: Extraocular movements intact.  Cardiovascular:     Rate and Rhythm: Normal rate.  Pulmonary:     Effort: Pulmonary effort is normal.  Genitourinary:    Comments: Patient declined exam. Skin:    General: Skin is warm and dry.  Neurological:     General: No focal deficit present.     Mental Status: She is alert and oriented to person, place, and time.  Psychiatric:        Mood and Affect: Mood normal.        Behavior: Behavior normal.      Assessment and Plan :   PDMP not reviewed this encounter.  1. Acute vaginitis   2. Vaginal discharge   3. Vaginal itching    Patient demonstrated a picture that shows significant white discharge, thick white patches over the labia minora.  Advised that we could cover for yeast vaginitis and bacterial vaginosis with fluconazole and clindamycin (which the patient prefers given lack of efficacy with metronidazole after using many times in the past).  We discussed the possibility of vulvar dermatitis this is what patient found as a source online.  I  counseled that this is a possibility but would be better to cover for BV and yeast infection at this time.  We will revisit this if symptoms do not improve and definitely if they worsen.  Labs pending.  Counseled patient on potential for adverse effects with medications prescribed/recommended today, ER and return-to-clinic precautions discussed, patient verbalized understanding.    Wallis Bamberg, [place] 05/03/22 2130

## 2022-05-03 NOTE — Discharge Instructions (Addendum)
We will let you know about your vaginal results and if we need to change your treatment based off of that result.

## 2022-05-04 ENCOUNTER — Ambulatory Visit: Payer: Self-pay

## 2022-05-07 LAB — CERVICOVAGINAL ANCILLARY ONLY
Bacterial Vaginitis (gardnerella): NEGATIVE
Candida Glabrata: NEGATIVE
Candida Vaginitis: POSITIVE — AB
Chlamydia: NEGATIVE
Comment: NEGATIVE
Comment: NEGATIVE
Comment: NEGATIVE
Comment: NEGATIVE
Comment: NEGATIVE
Comment: NORMAL
Neisseria Gonorrhea: NEGATIVE
Trichomonas: NEGATIVE

## 2022-06-15 ENCOUNTER — Encounter (HOSPITAL_COMMUNITY): Payer: Self-pay

## 2022-06-15 ENCOUNTER — Other Ambulatory Visit: Payer: Self-pay

## 2022-06-15 ENCOUNTER — Emergency Department (HOSPITAL_COMMUNITY)
Admission: EM | Admit: 2022-06-15 | Discharge: 2022-06-16 | Disposition: A | Discharge status: Home / Self Care | Payer: Medicaid Other | Attending: Emergency Medicine | Admitting: Emergency Medicine

## 2022-06-15 ENCOUNTER — Emergency Department (HOSPITAL_COMMUNITY): Payer: Medicaid Other

## 2022-06-15 DIAGNOSIS — S60511A Abrasion of right hand, initial encounter: Secondary | ICD-10-CM | POA: Insufficient documentation

## 2022-06-15 DIAGNOSIS — S6991XA Unspecified injury of right wrist, hand and finger(s), initial encounter: Secondary | ICD-10-CM | POA: Diagnosis present

## 2022-06-15 DIAGNOSIS — M542 Cervicalgia: Secondary | ICD-10-CM | POA: Insufficient documentation

## 2022-06-15 DIAGNOSIS — M79632 Pain in left forearm: Secondary | ICD-10-CM | POA: Diagnosis not present

## 2022-06-15 DIAGNOSIS — R519 Headache, unspecified: Secondary | ICD-10-CM | POA: Diagnosis not present

## 2022-06-15 LAB — CBC WITH DIFFERENTIAL/PLATELET
Abs Immature Granulocytes: 0.02 10*3/uL (ref 0.00–0.07)
Basophils Absolute: 0.1 10*3/uL (ref 0.0–0.1)
Basophils Relative: 1 %
Eosinophils Absolute: 0.1 10*3/uL (ref 0.0–0.5)
Eosinophils Relative: 1 %
HCT: 34.6 % — ABNORMAL LOW (ref 36.0–46.0)
Hemoglobin: 11.9 g/dL — ABNORMAL LOW (ref 12.0–15.0)
Immature Granulocytes: 0 %
Lymphocytes Relative: 47 %
Lymphs Abs: 4.9 10*3/uL — ABNORMAL HIGH (ref 0.7–4.0)
MCH: 29.9 pg (ref 26.0–34.0)
MCHC: 34.4 g/dL (ref 30.0–36.0)
MCV: 86.9 fL (ref 80.0–100.0)
Monocytes Absolute: 1 10*3/uL (ref 0.1–1.0)
Monocytes Relative: 9 %
Neutro Abs: 4.4 10*3/uL (ref 1.7–7.7)
Neutrophils Relative %: 42 %
Platelets: 305 10*3/uL (ref 150–400)
RBC: 3.98 MIL/uL (ref 3.87–5.11)
RDW: 12.6 % (ref 11.5–15.5)
WBC: 10.3 10*3/uL (ref 4.0–10.5)
nRBC: 0 % (ref 0.0–0.2)

## 2022-06-15 LAB — I-STAT CHEM 8, ED
BUN: 11 mg/dL (ref 6–20)
Calcium, Ion: 1.07 mmol/L — ABNORMAL LOW (ref 1.15–1.40)
Chloride: 104 mmol/L (ref 98–111)
Creatinine, Ser: 0.8 mg/dL (ref 0.44–1.00)
Glucose, Bld: 85 mg/dL (ref 70–99)
HCT: 36 % (ref 36.0–46.0)
Hemoglobin: 12.2 g/dL (ref 12.0–15.0)
Potassium: 3.5 mmol/L (ref 3.5–5.1)
Sodium: 139 mmol/L (ref 135–145)
TCO2: 23 mmol/L (ref 22–32)

## 2022-06-15 LAB — I-STAT BETA HCG BLOOD, ED (MC, WL, AP ONLY): I-stat hCG, quantitative: <5 m[IU]/mL (ref <5)

## 2022-06-15 MED ORDER — IOHEXOL 350 MG/ML SOLN
75.0000 mL | Freq: Once | INTRAVENOUS | Status: AC | PRN
  Administered 2022-06-15: 75 mL via INTRAVENOUS
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
## 2022-06-15 NOTE — ED Notes (Signed)
Pt refused lab work.

## 2022-06-16 MED ORDER — IBUPROFEN 800 MG PO TABS
800.0000 mg | ORAL_TABLET | Freq: Once | ORAL | Status: AC
  Administered 2022-06-16: 800 mg via ORAL
  Filled 2022-06-16: qty 1

## 2022-06-16 MED ORDER — BACITRACIN ZINC 500 UNIT/GM EX OINT
TOPICAL_OINTMENT | Freq: Two times a day (BID) | CUTANEOUS | Status: DC
  Administered 2022-06-16: 1 via TOPICAL
  Filled 2022-06-16 (×2): qty 1.8

## 2022-06-16 NOTE — ED Notes (Signed)
Pt A&OX4 ambulatory at d/c with independent steady gait. Pt verbalized understanding of d/c instructions and follow up care. This RN offered to contact social work or case management for safe placement but she refused.
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
## 2022-06-16 NOTE — ED Notes (Signed)
Pt ambulatory to hallway bed with independent steady gait

## 2022-06-16 NOTE — ED Notes (Signed)
Wound care: bacitracin applied, dressed with sterile gauze and wrapped with coband

## 2022-08-14 ENCOUNTER — Ambulatory Visit
Admission: EM | Admit: 2022-08-14 | Discharge: 2022-08-14 | Disposition: A | Discharge status: Home / Self Care | Payer: Medicaid Other | Attending: Urgent Care | Admitting: Urgent Care

## 2022-08-14 DIAGNOSIS — J020 Streptococcal pharyngitis: Secondary | ICD-10-CM

## 2022-08-14 LAB — POCT RAPID STREP A (OFFICE): Rapid Strep A Screen: POSITIVE — AB

## 2022-08-14 MED ORDER — FLUCONAZOLE 150 MG PO TABS: 150.0000 mg | ORAL_TABLET | ORAL | 0 refills | Status: DC

## 2022-08-14 MED ORDER — CLINDAMYCIN HCL 300 MG PO CAPS: 300.0000 mg | ORAL_CAPSULE | Freq: Three times a day (TID) | ORAL | 0 refills | Status: DC
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
## 2022-08-14 NOTE — ED Triage Notes (Signed)
Pt states that she has a sore throat, chills, body aches and headache. X1 day

## 2022-09-05 ENCOUNTER — Ambulatory Visit
Admission: EM | Admit: 2022-09-05 | Discharge: 2022-09-05 | Disposition: A | Discharge status: Home / Self Care | Payer: Medicaid Other | Attending: Emergency Medicine | Admitting: Emergency Medicine

## 2022-09-05 DIAGNOSIS — B349 Viral infection, unspecified: Secondary | ICD-10-CM

## 2022-09-05 DIAGNOSIS — R058 Other specified cough: Secondary | ICD-10-CM | POA: Diagnosis not present

## 2022-09-05 DIAGNOSIS — R519 Headache, unspecified: Secondary | ICD-10-CM | POA: Diagnosis not present

## 2022-09-05 MED ORDER — IBUPROFEN 800 MG PO TABS
800.0000 mg | ORAL_TABLET | Freq: Once | ORAL | Status: AC
  Administered 2022-09-05: 800 mg via ORAL
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
## 2022-09-05 NOTE — ED Triage Notes (Signed)
Pt c/o migraine since yesterday, cough, weakness, chills, and chest discomfort that began at the same time as the headache (centralized, occurs when breathing/ coughing)  Home intervention: motrin

## 2022-09-05 NOTE — Discharge Instructions (Signed)
You are welcome to continue taking ibuprofen 800 mg (four 200 mg tablets) every 6-8 hours as needed for headache.  You are also welcome to take a home COVID-19 test, based on the symptoms you described to me I believe that this is a possibility and testing is recommended.  If your home COVID-19 test is negative, CDC recommends that you repeat the test in 3 days, particularly if you are still having symptoms to confirm your negative result.  At this time your lungs are clear but if your cough becomes worse or you develop shortness of breath, please let us know.  Thank you for visiting urgent care today.

## 2022-10-26 ENCOUNTER — Ambulatory Visit
Admission: EM | Admit: 2022-10-26 | Discharge: 2022-10-26 | Disposition: A | Discharge status: Home / Self Care | Payer: Medicaid Other | Attending: Nurse Practitioner | Admitting: Nurse Practitioner

## 2022-10-26 DIAGNOSIS — N760 Acute vaginitis: Secondary | ICD-10-CM | POA: Insufficient documentation

## 2022-10-26 DIAGNOSIS — N926 Irregular menstruation, unspecified: Secondary | ICD-10-CM | POA: Diagnosis present

## 2022-10-26 DIAGNOSIS — N3001 Acute cystitis with hematuria: Secondary | ICD-10-CM | POA: Diagnosis present

## 2022-10-26 LAB — POCT URINALYSIS DIP (MANUAL ENTRY)
Bilirubin, UA: NEGATIVE
Glucose, UA: NEGATIVE mg/dL
Ketones, POC UA: NEGATIVE mg/dL
Nitrite, UA: NEGATIVE
Protein Ur, POC: 30 mg/dL — AB
Spec Grav, UA: 1.02 (ref 1.010–1.025)
Urobilinogen, UA: 0.2 E.U./dL
pH, UA: 8.5 — AB (ref 5.0–8.0)

## 2022-10-26 LAB — POCT URINE PREGNANCY: Preg Test, Ur: NEGATIVE

## 2022-10-26 MED ORDER — FLUCONAZOLE 150 MG PO TABS: 150.0000 mg | ORAL_TABLET | Freq: Every day | ORAL | 0 refills | Status: DC

## 2022-10-26 MED ORDER — NITROFURANTOIN MONOHYD MACRO 100 MG PO CAPS: 100.0000 mg | ORAL_CAPSULE | Freq: Two times a day (BID) | ORAL | 0 refills | Status: AC

## 2022-10-26 NOTE — ED Triage Notes (Signed)
Pt c/o vaginal burning sensation and states she missed her last menstrual cycle.   Started: a week ago

## 2022-10-26 NOTE — Discharge Instructions (Signed)
The clinic will contact you with results of the urine culture and vaginal swab done today Start Diflucan to treat a yeast infection Start Macrobid twice daily for 7 days to treat a UTI Rest and fluids Follow-up with your PCP if symptoms or not improving ER for any worsening symptoms
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
## 2022-10-28 LAB — CERVICOVAGINAL ANCILLARY ONLY
Bacterial Vaginitis (gardnerella): NEGATIVE
Candida Glabrata: NEGATIVE
Candida Vaginitis: POSITIVE — AB
Comment: NEGATIVE
Comment: NEGATIVE
Comment: NEGATIVE

## 2022-11-21 ENCOUNTER — Inpatient Hospital Stay (HOSPITAL_COMMUNITY)
Admission: AD | Admit: 2022-11-21 | Discharge: 2022-11-21 | Discharge status: Against Medical Advice | Payer: Medicaid Other | Attending: Obstetrics & Gynecology | Admitting: Obstetrics & Gynecology

## 2023-01-01 ENCOUNTER — Encounter (HOSPITAL_BASED_OUTPATIENT_CLINIC_OR_DEPARTMENT_OTHER): Payer: Self-pay | Admitting: Radiology

## 2023-01-01 ENCOUNTER — Other Ambulatory Visit: Payer: Self-pay

## 2023-01-01 ENCOUNTER — Emergency Department (HOSPITAL_BASED_OUTPATIENT_CLINIC_OR_DEPARTMENT_OTHER): Payer: Medicaid Other

## 2023-01-01 DIAGNOSIS — R079 Chest pain, unspecified: Secondary | ICD-10-CM | POA: Insufficient documentation

## 2023-01-01 LAB — CBC
HCT: 34.1 % — ABNORMAL LOW (ref 36.0–46.0)
Hemoglobin: 11.4 g/dL — ABNORMAL LOW (ref 12.0–15.0)
MCH: 29.5 pg (ref 26.0–34.0)
MCHC: 33.4 g/dL (ref 30.0–36.0)
MCV: 88.3 fL (ref 80.0–100.0)
Platelets: 264 10*3/uL (ref 150–400)
RBC: 3.86 MIL/uL — ABNORMAL LOW (ref 3.87–5.11)
RDW: 12.4 % (ref 11.5–15.5)
WBC: 9.2 10*3/uL (ref 4.0–10.5)
nRBC: 0 % (ref 0.0–0.2)

## 2023-01-01 LAB — BASIC METABOLIC PANEL
Anion gap: 12 (ref 5–15)
BUN: 13 mg/dL (ref 6–20)
CO2: 25 mmol/L (ref 22–32)
Calcium: 8.9 mg/dL (ref 8.9–10.3)
Chloride: 97 mmol/L — ABNORMAL LOW (ref 98–111)
Creatinine, Ser: 0.84 mg/dL (ref 0.44–1.00)
GFR, Estimated: >60 mL/min (ref >60)
Glucose, Bld: 96 mg/dL (ref 70–99)
Potassium: 3.3 mmol/L — ABNORMAL LOW (ref 3.5–5.1)
Sodium: 134 mmol/L — ABNORMAL LOW (ref 135–145)

## 2023-01-01 LAB — TROPONIN I (HIGH SENSITIVITY): Troponin I (High Sensitivity): <2 ng/L (ref <18)

## 2023-01-01 NOTE — ED Triage Notes (Signed)
Pt states she started having chest pain a week ago and the past 48 hours has gotten worse. PT endorses having shortness of breath with this chest pain.

## 2023-01-02 ENCOUNTER — Emergency Department (HOSPITAL_BASED_OUTPATIENT_CLINIC_OR_DEPARTMENT_OTHER)
Admission: EM | Admit: 2023-01-02 | Discharge: 2023-01-02 | Disposition: A | Discharge status: Home / Self Care | Payer: Medicaid Other | Attending: Emergency Medicine | Admitting: Emergency Medicine

## 2023-01-02 DIAGNOSIS — R079 Chest pain, unspecified: Secondary | ICD-10-CM

## 2023-01-02 MED ORDER — NAPROXEN 250 MG PO TABS
500.0000 mg | ORAL_TABLET | Freq: Once | ORAL | Status: AC
  Administered 2023-01-02: 500 mg via ORAL
  Filled 2023-01-02: qty 2

## 2023-01-02 MED ORDER — NAPROXEN 500 MG PO TABS: 500.0000 mg | ORAL_TABLET | Freq: Two times a day (BID) | ORAL | 0 refills | Status: DC

## 2023-01-02 NOTE — Discharge Instructions (Signed)
You were evaluated in the Emergency Department and after careful evaluation, we did not find any emergent condition requiring admission or further testing in the hospital.  Your exam/testing today is overall reassuring.  Symptoms may be due to costochondritis.  Take the Naprosyn twice daily as needed for discomfort.  Please return to the Emergency Department if you experience any worsening of your condition.   Thank you for allowing [place] to be a part of your care.

## 2023-01-02 NOTE — ED Provider Notes (Signed)
[facility]EMERGENCY DEPT [facility] Emergency Department Provider Note MRN:  [id number]  Arrival date & time: 01/02/23     Chief Complaint   Chest Pain   History of Present Illness   Jaime Leonard is a 32 y.o. year-old female with no pertinent past medical history presenting to the ED with chief complaint of chest pain.  Pain to the left side of the chest, worse with certain positions, worse with deep breaths, no shortness of breath, no leg pain or swelling, no history of blood clots, does not take birth control pills.  Review of Systems  A thorough review of systems was obtained and all systems are negative except as noted in the HPI and PMH.   Patient's Health History    Past Medical History:  Diagnosis Date   History of chicken pox    Ovarian cyst    Trichomonas contact, treated    Vitamin D deficiency    Yeast infection     Past Surgical History:  Procedure Laterality Date   DILATION AND EVACUATION N/A 05/10/2013   Procedure: DILATATION AND EVACUATION;  Surgeon: Purcell Nails, MD;  Location: [facility];  Service: Gynecology;  Laterality: N/A;   INDUCED ABORTION  06/05/2012   elective   WISDOM TOOTH EXTRACTION      Family History  Problem Relation Age of Onset   Hypertension Mother    Cancer Mother    Hypertension Maternal Grandmother    Diabetes Maternal Grandmother    Kidney failure Maternal Grandmother    Stroke Maternal Grandmother    Diabetes Paternal Grandmother    Diabetes Father     Social History   Socioeconomic History   Marital status: Single    Spouse name: Not on file   Number of children: Not on file   Years of education: Not on file   Highest education level: Not on file  Occupational History   Not on file  Tobacco Use   Smoking status: Never   Smokeless tobacco: Never  Vaping Use   Vaping Use: Some days  Substance and Sexual Activity   Alcohol use: Yes    Alcohol/week: 1.0 standard drink of alcohol    Types: 1 Standard drinks or  equivalent per week    Comment: occasionally   Drug use: No   Sexual activity: Yes    Birth control/protection: Condom  Other Topics Concern   Not on file  Social History Narrative   Not on file   Social Determinants of Health   Financial Resource Strain: Not on file  Food Insecurity: Not on file  Transportation Needs: Not on file  Physical Activity: Not on file  Stress: Not on file  Social Connections: Not on file  Intimate Partner Violence: Not on file     Physical Exam   Vitals:   01/01/23 2309 01/01/23 2312  BP: 114/76   Pulse: 75   Resp: 18   Temp: 98 F (36.7 C)   SpO2: 100% 100%    CONSTITUTIONAL: Well-appearing, NAD NEURO/PSYCH:  Alert and oriented x 3, no focal deficits EYES:  eyes equal and reactive ENT/NECK:  no LAD, no JVD CARDIO: Regular rate, well-perfused, normal S1 and S2 PULM:  CTAB no wheezing or rhonchi GI/GU:  non-distended, non-tender MSK/SPINE:  No gross deformities, no edema SKIN:  no rash, atraumatic   *Additional and/or pertinent findings included in MDM below  Diagnostic and Interventional Summary    EKG Interpretation  Date/Time:  Wednesday Jan 01 2023 23:11:59 EDT  Ventricular Rate:  74 PR Interval:  149 QRS Duration: 92 QT Interval:  382 QTC Calculation: 424 R Axis:   102 Text Interpretation: Sinus rhythm Borderline right axis deviation Confirmed by Kennis Carina [phone]) on 01/02/2023 12:05:38 AM       Labs Reviewed  BASIC METABOLIC PANEL - Abnormal; Notable for the following components:      Result Value   Sodium 134 (*)    Potassium 3.3 (*)    Chloride 97 (*)    All other components within normal limits  CBC - Abnormal; Notable for the following components:   RBC 3.86 (*)    Hemoglobin 11.4 (*)    HCT 34.1 (*)    All other components within normal limits  PREGNANCY, URINE  TROPONIN I (HIGH SENSITIVITY)  TROPONIN I (HIGH SENSITIVITY)    DG Chest 2 View  Final Result      Medications  naproxen (NAPROSYN)  tablet 500 mg (has no administration in time range)     Procedures  /  Critical Care Procedures  ED Course and Medical Decision Making  Initial Impression and Ddx Patient is sitting comfortably with normal vital signs, doubt PE, PERC negative.  Favoring costochondritis.  Past medical/surgical history that increases complexity of ED encounter: None  Interpretation of Diagnostics I personally reviewed the EKG and my interpretation is as follows: Sinus rhythm  Labs reassuring with no significant blood count or electrolyte disturbance, troponin negative, chest x-ray normal  Patient Reassessment and Ultimate Disposition/Management     Discharge  Patient management required discussion with the following services or consulting groups:  None  Complexity of Problems Addressed Acute illness or injury that poses threat of life of bodily function  Additional Data Reviewed and Analyzed Further history obtained from: None  Additional Factors Impacting ED Encounter Risk Prescriptions  Elmer Sow. Pilar Plate, MD [facility] Emergency Medicine [facility] [email or web address]  Final Clinical Impressions(s) / ED Diagnoses     ICD-10-CM   1. Chest pain, unspecified type  R07.9       ED Discharge Orders          Ordered    naproxen (NAPROSYN) 500 MG tablet  2 times daily        01/02/23 0109             Discharge Instructions Discussed with and Provided to Patient:    Discharge Instructions      You were evaluated in the Emergency Department and after careful evaluation, we did not find any emergent condition requiring admission or further testing in the hospital.  Your exam/testing today is overall reassuring.  Symptoms may be due to costochondritis.  Take the Naprosyn twice daily as needed for discomfort.  Please return to the Emergency Department if you experience any worsening of your condition.   Thank you for allowing [place] to be a part of your care.       Sabas Sous, MD 01/02/23 0111

## 2023-01-03 ENCOUNTER — Encounter: Payer: Medicaid Other | Admitting: Obstetrics and Gynecology

## 2023-05-27 ENCOUNTER — Other Ambulatory Visit (HOSPITAL_COMMUNITY)
Admission: RE | Admit: 2023-05-27 | Discharge: 2023-05-27 | Disposition: A | Discharge status: Home / Self Care | Payer: Medicaid Other | Source: Ambulatory Visit | Attending: Obstetrics and Gynecology | Admitting: Obstetrics and Gynecology

## 2023-05-27 ENCOUNTER — Encounter: Payer: Self-pay | Admitting: Obstetrics & Gynecology

## 2023-05-27 ENCOUNTER — Ambulatory Visit: Payer: Medicaid Other | Admitting: Obstetrics & Gynecology

## 2023-05-27 VITALS — BP 111/75 | HR 82 | Ht 66.0 in | Wt 124.0 lb

## 2023-05-27 DIAGNOSIS — Z01419 Encounter for gynecological examination (general) (routine) without abnormal findings: Secondary | ICD-10-CM | POA: Diagnosis not present

## 2023-05-27 DIAGNOSIS — Z1339 Encounter for screening examination for other mental health and behavioral disorders: Secondary | ICD-10-CM

## 2023-05-27 NOTE — Progress Notes (Signed)
32 y.o. New GYN presents for AEX/PAP.  C/o creamy vaginal discharge, odor.

## 2023-05-27 NOTE — Progress Notes (Unsigned)
GYNECOLOGY CLINIC ANNUAL PREVENTATIVE CARE ENCOUNTER NOTE  Subjective:   Jaime Leonard is a 32 y.o. [phone] female here for a routine annual gynecologic exam.  Current complaints: vaginal discharge for 2 months and she wants STD testing.   Denies abnormal vaginal bleeding, pelvic pain, problems with intercourse or other gynecologic concerns.    Gynecologic History Patient's last menstrual period was 04/27/2023 (exact date). Contraception: none Last Pap: 2019. Results were: normal Last mammogram: n/a.  Obstetric History OB History  Gravida Para Term Preterm AB Living  5 3 3   2 3   SAB IAB Ectopic Multiple Live Births  1 1   0 3    # Outcome Date GA Lbr Len/2nd Weight Sex Type Anes PTL Lv  5 Term 10/04/17 [redacted]w[redacted]d 01:19 / 00:11 7 lb 14.6 oz (3.59 kg) F Vag-Spont EPI  LIV  4 Term 05/05/14 [redacted]w[redacted]d 139:50 / 02:39 7 lb 15.5 oz (3.615 kg) F Vag-Spont EPI  LIV  3 Term 01/03/10 [redacted]w[redacted]d  8 lb 4 oz (3.742 kg) M Vag-Spont   LIV  2 SAB           1 IAB             Past Medical History:  Diagnosis Date   Anxiety    History of chicken pox    Ovarian cyst    Trichomonas contact, treated    Vaginal Pap smear, abnormal    Vitamin D deficiency    Yeast infection     Past Surgical History:  Procedure Laterality Date   DILATION AND CURETTAGE OF UTERUS     DILATION AND EVACUATION N/A 05/10/2013   Procedure: DILATATION AND EVACUATION;  Surgeon: Purcell Nails, MD;  Location: [facility];  Service: Gynecology;  Laterality: N/A;   INDUCED ABORTION  06/05/2012   elective   WISDOM TOOTH EXTRACTION      Current Outpatient Medications on File Prior to Visit  Medication Sig Dispense Refill   amphetamine-dextroamphetamine (ADDERALL XR) 10 MG 24 hr capsule Take 10 mg by mouth every morning.     buPROPion (WELLBUTRIN XL) 300 MG 24 hr tablet Take 300 mg by mouth daily.     fluconazole (DIFLUCAN) 150 MG tablet Take 1 tablet (150 mg total) by mouth daily. May repeat dose in 3 days if symptoms or not  improving (Patient not taking: Reported on 05/27/2023) 2 tablet 0   metroNIDAZOLE (FLAGYL) 500 MG tablet Take 500 mg by mouth 2 (two) times daily. (Patient not taking: Reported on 05/27/2023)     naproxen (NAPROSYN) 500 MG tablet Take 1 tablet (500 mg total) by mouth 2 (two) times daily. (Patient not taking: Reported on 05/27/2023) 30 tablet 0   No current facility-administered medications on file prior to visit.    Allergies  Allergen Reactions   Penicillins Hives    Has patient had a PCN reaction causing immediate rash, facial/tongue/throat swelling, SOB or lightheadedness with hypotension: No Has patient had a PCN reaction causing severe rash involving mucus membranes or skin necrosis: No Has patient had a PCN reaction that required hospitalization: No Has patient had a PCN reaction occurring within the last 10 years: Yes If all of the above answers are "NO", then may proceed with Cephalosporin use.    Shellfish Allergy Hives    Social History   Socioeconomic History   Marital status: Single    Spouse name: Not on file   Number of children: 3   Years of education: Not on  file   Highest education level: Not on file  Occupational History   Not on file  Tobacco Use   Smoking status: Never   Smokeless tobacco: Current  Vaping Use   Vaping status: Some Days  Substance and Sexual Activity   Alcohol use: Yes    Alcohol/week: 1.0 standard drink of alcohol    Types: 1 Standard drinks or equivalent per week    Comment: occasionally   Drug use: No   Sexual activity: Yes    Birth control/protection: None  Other Topics Concern   Not on file  Social History Narrative   Not on file   Social Determinants of Health   Financial Resource Strain: At Risk (10/04/2022)   Received from [place], [place]   Financial [employer]    Financial Resource Strain: 2  Food Insecurity: Not at Risk (10/04/2022)   Received from [employer], [place]   Food Insecurity    Food: 1  Transportation Needs: Not at  Risk (10/04/2022)   Received from [place], [employer] Needs    Transportation: 1  Physical Activity: At Risk (10/04/2022)   Received from [place], [place]   Physical Activity    Physical Activity: 2  Stress: Not on File (09/09/2022)   Received from [facility], [place]   Stress    Stress: 0  Social Connections: Not on File (05/13/2023)   Received from [employer]   Social Connections    Connectedness: 0  Intimate Partner Violence: Not on file    Family History  Problem Relation Age of Onset   Hypertension Mother    Cancer Mother    Hypertension Maternal Grandmother    Diabetes Maternal Grandmother    Kidney failure Maternal Grandmother    Stroke Maternal Grandmother    Diabetes Paternal Grandmother    Diabetes Father     The following portions of the patient's history were reviewed and updated as appropriate: allergies, current medications, past family history, past medical history, past social history, past surgical history and problem list.  Review of Systems Genitourinary:positive for discharge   Objective:  BP 111/75   Pulse 82   Ht 5\' 6"  (1.676 m)   Wt 124 lb (56.2 kg)   LMP 04/27/2023 (Exact Date)   BMI 20.01 kg/m  CONSTITUTIONAL: Well-developed, well-nourished female in no acute distress.  HENT:  Normocephalic, atraumatic, External right and left ear normal. Oropharynx is clear and moist EYES: Conjunctivae and EOM are normal. Pupils are equal, round, and reactive to light. No scleral icterus.  NECK: Normal range of motion, supple, no masses.  Normal thyroid.  SKIN: Skin is warm and dry. No rash noted. Not diaphoretic. No erythema. No pallor. NEUROLGIC: Alert and oriented to person, place, and time. Normal reflexes, muscle tone coordination. No cranial nerve deficit noted. PSYCHIATRIC: Normal mood and affect. Normal behavior. Normal judgment and thought content. CARDIOVASCULAR: Normal heart rate noted, regular rhythm RESPIRATORY: Clear to auscultation bilaterally. Effort  and breath sounds normal, no problems with respiration noted. BREASTS: Symmetric in size. No masses, skin changes, nipple drainage, or lymphadenopathy. ABDOMEN: Soft, normal bowel sounds, no distention noted.  No tenderness, rebound or guarding.  PELVIC: Normal appearing external genitalia; normal appearing vaginal mucosa and cervix.  No abnormal discharge noted.  Pap smear obtained.  Normal uterine size, no other palpable masses, no uterine or adnexal tenderness. MUSCULOSKELETAL: Normal range of motion. No tenderness.  No cyanosis, clubbing, or edema.     Assessment:  Annual gynecologic examination with pap smear  STD screen  Plan:  Will follow up results of pap smear and manage accordingly. Mammogram scheduled Routine preventative health maintenance measures emphasized. Please refer to After Visit Summary for other counseling recommendations.    Adam Phenix, MD Attending Obstetrician & Gynecologist Center for [facility], [facility]  Patient ID: Jaime Leonard, female   DOB: 09/26/1990, 32 y.o.   MRN: [id number]

## 2023-05-28 LAB — CERVICOVAGINAL ANCILLARY ONLY
Bacterial Vaginitis (gardnerella): POSITIVE — AB
Candida Glabrata: NEGATIVE
Candida Vaginitis: NEGATIVE
Chlamydia: NEGATIVE
Comment: NEGATIVE
Comment: NEGATIVE
Comment: NEGATIVE
Comment: NEGATIVE
Comment: NEGATIVE
Comment: NORMAL
Neisseria Gonorrhea: NEGATIVE
Trichomonas: NEGATIVE

## 2023-05-29 ENCOUNTER — Other Ambulatory Visit: Payer: Self-pay

## 2023-05-29 DIAGNOSIS — N760 Acute vaginitis: Secondary | ICD-10-CM

## 2023-05-29 MED ORDER — METRONIDAZOLE 500 MG PO TABS
500.0000 mg | ORAL_TABLET | Freq: Two times a day (BID) | ORAL | 0 refills | Status: DC
Start: 2023-05-29 — End: 2023-10-01

## 2023-06-03 LAB — CYTOLOGY - PAP
Comment: NEGATIVE
Diagnosis: NEGATIVE
High risk HPV: NEGATIVE

## 2023-06-16 ENCOUNTER — Other Ambulatory Visit (HOSPITAL_COMMUNITY): Payer: Self-pay

## 2023-06-16 MED ORDER — AMPHETAMINE-DEXTROAMPHET ER 30 MG PO CP24
30.0000 mg | ORAL_CAPSULE | Freq: Every morning | ORAL | 0 refills | Status: DC
  Filled 2023-06-16: qty 30, 30d supply, fill #0

## 2023-06-17 ENCOUNTER — Other Ambulatory Visit (HOSPITAL_COMMUNITY): Payer: Self-pay

## 2023-07-18 ENCOUNTER — Other Ambulatory Visit (HOSPITAL_COMMUNITY): Payer: Self-pay

## 2023-07-18 MED ORDER — AMPHETAMINE-DEXTROAMPHET ER 30 MG PO CP24
30.0000 mg | ORAL_CAPSULE | Freq: Every morning | ORAL | 0 refills | Status: DC
  Filled 2023-07-18: qty 30, 30d supply, fill #0

## 2023-07-19 ENCOUNTER — Other Ambulatory Visit (HOSPITAL_COMMUNITY): Payer: Self-pay

## 2023-07-29 ENCOUNTER — Other Ambulatory Visit (HOSPITAL_COMMUNITY): Payer: Self-pay

## 2023-07-29 MED ORDER — AMPHETAMINE-DEXTROAMPHET ER 30 MG PO CP24
30.0000 mg | ORAL_CAPSULE | Freq: Every morning | ORAL | 0 refills | Status: DC
  Filled 2023-08-18: qty 30, 30d supply, fill #0

## 2023-07-30 ENCOUNTER — Other Ambulatory Visit (HOSPITAL_COMMUNITY): Payer: Self-pay

## 2023-07-30 MED ORDER — DISULFIRAM 500 MG PO TABS
500.0000 mg | ORAL_TABLET | Freq: Every morning | ORAL | 0 refills | Status: DC
  Filled 2023-07-30: qty 30, 30d supply, fill #0

## 2023-08-04 ENCOUNTER — Other Ambulatory Visit (HOSPITAL_COMMUNITY): Payer: Self-pay

## 2023-08-08 ENCOUNTER — Other Ambulatory Visit (HOSPITAL_COMMUNITY): Payer: Self-pay

## 2023-08-18 ENCOUNTER — Other Ambulatory Visit (HOSPITAL_COMMUNITY): Payer: Self-pay

## 2023-09-05 ENCOUNTER — Ambulatory Visit: Payer: Medicaid Other

## 2023-09-17 ENCOUNTER — Ambulatory Visit: Payer: Medicaid Other | Admitting: Emergency Medicine

## 2023-09-17 VITALS — BP 116/78 | HR 85 | Wt 123.0 lb

## 2023-09-17 DIAGNOSIS — N912 Amenorrhea, unspecified: Secondary | ICD-10-CM

## 2023-09-17 NOTE — Progress Notes (Signed)
Patient presents to discuss missed menses.  States that her LMP was 07/29/23. Took home pregnancy test on 12/27 and reports faint positive. Reports positive home pregnancy test on 1/7. Reports that she went to [facility] and was told that she is not pregnant on 1/8. Pt also reports right sided pelvic pain.  Following consult with Debroah Loop, MD, betaHCG drawn to evaluate pt's pregnancy status.

## 2023-09-18 LAB — BETA HCG QUANT (REF LAB): hCG Quant: <1 m[IU]/mL

## 2023-09-23 ENCOUNTER — Other Ambulatory Visit (HOSPITAL_COMMUNITY): Payer: Self-pay

## 2023-09-23 ENCOUNTER — Telehealth: Payer: Self-pay | Admitting: *Deleted

## 2023-09-23 MED ORDER — AMPHETAMINE-DEXTROAMPHET ER 30 MG PO CP24
30.0000 mg | ORAL_CAPSULE | Freq: Every morning | ORAL | 0 refills | Status: DC
  Filled 2023-09-23: qty 30, 30d supply, fill #0

## 2023-09-23 NOTE — Telephone Encounter (Signed)
RTC to go over bHCG results from 09/17/23 per pt request. Results <1. Advised pt that she was not pregnant on that day. Advised follow up in the office as needed for concern for lack on menses. Pt verbalized understanding.

## 2023-09-24 ENCOUNTER — Ambulatory Visit: Payer: Medicaid Other | Admitting: Obstetrics & Gynecology

## 2023-10-01 ENCOUNTER — Encounter: Payer: Self-pay | Admitting: Obstetrics & Gynecology

## 2023-10-01 ENCOUNTER — Ambulatory Visit: Payer: Medicaid Other | Admitting: Obstetrics & Gynecology

## 2023-10-01 VITALS — BP 110/74 | HR 85 | Ht 67.0 in | Wt 123.0 lb

## 2023-10-01 DIAGNOSIS — N3000 Acute cystitis without hematuria: Secondary | ICD-10-CM | POA: Diagnosis not present

## 2023-10-01 LAB — POCT URINALYSIS DIPSTICK
Bilirubin, UA: NEGATIVE
Glucose, UA: NEGATIVE
Ketones, UA: NEGATIVE
Nitrite, UA: POSITIVE
Protein, UA: NEGATIVE
Spec Grav, UA: 1.01 (ref 1.010–1.025)
Urobilinogen, UA: 0.2 U/dL
pH, UA: 5 (ref 5.0–8.0)

## 2023-10-01 MED ORDER — SULFAMETHOXAZOLE-TRIMETHOPRIM 800-160 MG PO TABS
1.0000 | ORAL_TABLET | Freq: Two times a day (BID) | ORAL | 1 refills | Status: DC
Start: 2023-10-01 — End: 2024-03-20

## 2023-10-01 NOTE — Progress Notes (Signed)
33 y.o. GYN presents for pelvic pain 8/10 x 3-4 weeks, urinary frequency.

## 2023-10-01 NOTE — Progress Notes (Signed)
Patient ID: Jaime Leonard, female   DOB: [birth year], 33 y.o.   MRN: [id number]  Chief Complaint  Patient presents with   Pelvic Pain    HPI Jaime Leonard is a 33 y.o. female.  [id number] Patient's last menstrual period was 07/29/2023 (exact date). Several days of lower abdominal pain and urinary frequency, no F&C HPI  Past Medical History:  Diagnosis Date   Anxiety    History of chicken pox    Ovarian cyst    Trichomonas contact, treated    Vaginal Pap smear, abnormal    Vitamin D deficiency    Yeast infection     Past Surgical History:  Procedure Laterality Date   DILATION AND CURETTAGE OF UTERUS     DILATION AND EVACUATION N/A 05/10/2013   Procedure: DILATATION AND EVACUATION;  Surgeon: Purcell Nails, MD;  Location: [facility];  Service: Gynecology;  Laterality: N/A;   INDUCED ABORTION  06/05/2012   elective   WISDOM TOOTH EXTRACTION      Family History  Problem Relation Age of Onset   Hypertension Mother    Cancer Mother    Hypertension Maternal Grandmother    Diabetes Maternal Grandmother    Kidney failure Maternal Grandmother    Stroke Maternal Grandmother    Diabetes Paternal Grandmother    Diabetes Father     Social History Social History   Tobacco Use   Smoking status: Never   Smokeless tobacco: Current  Vaping Use   Vaping status: Some Days  Substance Use Topics   Alcohol use: Yes    Alcohol/week: 1.0 standard drink of alcohol    Types: 1 Standard drinks or equivalent per week    Comment: occasionally   Drug use: No    Allergies  Allergen Reactions   Penicillins Hives    Has patient had a PCN reaction causing immediate rash, facial/tongue/throat swelling, SOB or lightheadedness with hypotension: No Has patient had a PCN reaction causing severe rash involving mucus membranes or skin necrosis: No Has patient had a PCN reaction that required hospitalization: No Has patient had a PCN reaction occurring within the last 10 years: Yes If all of  the above answers are "NO", then may proceed with Cephalosporin use.    Shellfish Allergy Hives    Current Outpatient Medications  Medication Sig Dispense Refill   amphetamine-dextroamphetamine (ADDERALL XR) 30 MG 24 hr capsule Take 1 capsule (30 mg total) by mouth in the morning. 30 capsule 0   buPROPion (WELLBUTRIN XL) 300 MG 24 hr tablet Take 300 mg by mouth daily.     sulfamethoxazole-trimethoprim (BACTRIM DS) 800-160 MG tablet Take 1 tablet by mouth 2 (two) times daily. 6 tablet 1   amphetamine-dextroamphetamine (ADDERALL XR) 10 MG 24 hr capsule Take 10 mg by mouth every morning.     amphetamine-dextroamphetamine (ADDERALL XR) 30 MG 24 hr capsule Take 1 capsule (30 mg total) by mouth every morning. 30 capsule 0   Disulfiram 500 MG TABS Take 1 tablet (500 mg total) by mouth in the morning. (Patient not taking: Reported on 09/17/2023) 30 tablet 0   fluconazole (DIFLUCAN) 150 MG tablet Take 1 tablet (150 mg total) by mouth daily. May repeat dose in 3 days if symptoms or not improving (Patient not taking: Reported on 05/27/2023) 2 tablet 0   naproxen (NAPROSYN) 500 MG tablet Take 1 tablet (500 mg total) by mouth 2 (two) times daily. (Patient not taking: Reported on 05/27/2023) 30 tablet 0   No current facility-administered medications  for this visit.    Review of Systems Review of Systems  Constitutional: Negative.   HENT: Negative.    Respiratory: Negative.    Cardiovascular: Negative.   Gastrointestinal: Negative.   Genitourinary:  Positive for dysuria, frequency and pelvic pain. Negative for flank pain.    Blood pressure 110/74, pulse 85, height 5\' 7"  (1.702 m), weight 123 lb (55.8 kg), last menstrual period 07/29/2023.  Physical Exam Physical Exam Vitals and nursing note reviewed. Exam conducted with a chaperone present.  Constitutional:      Appearance: Normal appearance.  HENT:     Head: Normocephalic and atraumatic.  Eyes:     Pupils: Pupils are equal, round, and reactive  to light.  Cardiovascular:     Rate and Rhythm: Normal rate.  Pulmonary:     Effort: Pulmonary effort is normal.  Abdominal:     General: Abdomen is flat.     Palpations: Abdomen is soft.     Tenderness: There is no abdominal tenderness.  Musculoskeletal:        General: Normal range of motion.  Neurological:     Mental Status: She is alert.  Psychiatric:        Mood and Affect: Mood normal.        Behavior: Behavior normal.     Data Reviewed Urine dipstick shows positive for RBC's, positive for protein, positive for nitrates, and positive for leukocytes.  Micro exam: not done.   Assessment Acute cystitis without hematuria - Plan: POCT Urinalysis Dipstick, Urine Culture, sulfamethoxazole-trimethoprim (BACTRIM DS) 800-160 MG tablet   Plan Meds ordered this encounter  Medications   sulfamethoxazole-trimethoprim (BACTRIM DS) 800-160 MG tablet    Sig: Take 1 tablet by mouth 2 (two) times daily.    Dispense:  6 tablet    Refill:  1       Scheryl Darter 10/01/2023, 11:58 AM

## 2023-10-07 LAB — URINE CULTURE

## 2023-10-27 ENCOUNTER — Other Ambulatory Visit (HOSPITAL_COMMUNITY): Payer: Self-pay

## 2023-10-30 ENCOUNTER — Other Ambulatory Visit (HOSPITAL_COMMUNITY): Payer: Self-pay

## 2023-10-30 MED ORDER — AMPHETAMINE-DEXTROAMPHET ER 30 MG PO CP24
30.0000 mg | ORAL_CAPSULE | Freq: Every morning | ORAL | 0 refills | Status: DC
  Filled 2023-10-30: qty 30, 30d supply, fill #0

## 2023-10-30 MED ORDER — BUPROPION HCL ER (XL) 300 MG PO TB24
300.0000 mg | ORAL_TABLET | Freq: Every day | ORAL | 0 refills | Status: DC
  Filled 2023-10-30: qty 90, 90d supply, fill #0

## 2023-12-22 ENCOUNTER — Other Ambulatory Visit (HOSPITAL_COMMUNITY): Payer: Self-pay

## 2023-12-22 MED ORDER — AMPHETAMINE-DEXTROAMPHET ER 30 MG PO CP24
30.0000 mg | ORAL_CAPSULE | Freq: Every morning | ORAL | 0 refills | Status: DC
  Filled 2024-01-28: qty 30, 30d supply, fill #0

## 2023-12-22 MED ORDER — BUPROPION HCL ER (XL) 300 MG PO TB24
300.0000 mg | ORAL_TABLET | Freq: Every day | ORAL | 0 refills | Status: DC
  Filled 2023-12-22: qty 90, 90d supply, fill #0
  Filled 2023-12-24: qty 30, 30d supply, fill #0
  Filled 2024-03-01: qty 60, 60d supply, fill #1

## 2023-12-22 MED ORDER — AMPHETAMINE-DEXTROAMPHET ER 30 MG PO CP24
30.0000 mg | ORAL_CAPSULE | Freq: Every morning | ORAL | 0 refills | Status: AC
  Filled 2024-03-02: qty 30, 30d supply, fill #0

## 2023-12-22 MED ORDER — AMPHETAMINE-DEXTROAMPHET ER 30 MG PO CP24
30.0000 mg | ORAL_CAPSULE | Freq: Every morning | ORAL | 0 refills | Status: DC
  Filled 2023-12-22: qty 30, 30d supply, fill #0

## 2023-12-22 MED ORDER — DISULFIRAM 500 MG PO TABS
500.0000 mg | ORAL_TABLET | Freq: Every morning | ORAL | 0 refills | Status: AC
  Filled 2023-12-22: qty 90, 90d supply, fill #0

## 2023-12-23 ENCOUNTER — Other Ambulatory Visit (HOSPITAL_COMMUNITY): Payer: Self-pay

## 2023-12-24 ENCOUNTER — Other Ambulatory Visit (HOSPITAL_COMMUNITY): Payer: Self-pay

## 2023-12-25 ENCOUNTER — Other Ambulatory Visit (HOSPITAL_COMMUNITY): Payer: Self-pay

## 2024-01-28 ENCOUNTER — Other Ambulatory Visit (HOSPITAL_COMMUNITY): Payer: Self-pay

## 2024-03-01 ENCOUNTER — Other Ambulatory Visit (HOSPITAL_COMMUNITY): Payer: Self-pay

## 2024-03-02 ENCOUNTER — Other Ambulatory Visit (HOSPITAL_COMMUNITY): Payer: Self-pay

## 2024-03-02 ENCOUNTER — Other Ambulatory Visit: Payer: Self-pay

## 2024-03-12 ENCOUNTER — Other Ambulatory Visit (HOSPITAL_COMMUNITY): Payer: Self-pay

## 2024-03-17 ENCOUNTER — Ambulatory Visit: Admitting: Obstetrics and Gynecology

## 2024-03-17 ENCOUNTER — Other Ambulatory Visit (HOSPITAL_COMMUNITY)
Admission: RE | Admit: 2024-03-17 | Discharge: 2024-03-17 | Disposition: A | Discharge status: Home / Self Care | Source: Ambulatory Visit | Attending: Obstetrics and Gynecology | Admitting: Obstetrics and Gynecology

## 2024-03-17 ENCOUNTER — Encounter: Payer: Self-pay | Admitting: Obstetrics and Gynecology

## 2024-03-17 VITALS — BP 113/76 | HR 91 | Ht 66.0 in | Wt 119.0 lb

## 2024-03-17 DIAGNOSIS — Z59819 Housing instability, housed unspecified: Secondary | ICD-10-CM | POA: Diagnosis not present

## 2024-03-17 DIAGNOSIS — N898 Other specified noninflammatory disorders of vagina: Secondary | ICD-10-CM | POA: Diagnosis not present

## 2024-03-17 DIAGNOSIS — Z113 Encounter for screening for infections with a predominantly sexual mode of transmission: Secondary | ICD-10-CM

## 2024-03-17 DIAGNOSIS — R350 Frequency of micturition: Secondary | ICD-10-CM

## 2024-03-17 LAB — POCT URINALYSIS DIPSTICK
Bilirubin, UA: NEGATIVE
Glucose, UA: NEGATIVE
Ketones, UA: NEGATIVE
Nitrite, UA: NEGATIVE
Protein, UA: POSITIVE — AB
Spec Grav, UA: 1.015 (ref 1.010–1.025)
Urobilinogen, UA: 0.2 U/dL
pH, UA: 7.5 (ref 5.0–8.0)

## 2024-03-17 NOTE — Patient Instructions (Signed)
 What is bladder training?  Bladder training means changing your habits to better control your bladder. It can help with urinary problems like:  ?Frequency - This means having to use the toilet very frequently.  ?Urgency - This means suddenly feeling the need to urinate right away.  ?Leakage - This is when urgency leads to actually leaking urine. It is also called urge incontinence.  What is a bladder diary?  Bladder training involves trying to increase the amount of time between trips to the toilet. A bladder diary is a way to keep track of how often you go (form 1). Doctors sometimes call this a voiding diary.  In the diary, write down:  ?The time you urinate  ?The amount of urine, if your doctor asked you to measure this  ?If you had any leaking, how much (small, medium, or large amount), and what you were doing when the leakage happened  ?The amounts and types of fluids you drink  ?Any other symptoms you have   How do I train my bladder?  Once you have an idea of how often you are urinating, try to wait a little longer between trips to the toilet. This should be a gradual process:  ?First, slightly increase the amount of time between bathroom visits. For example, if you normally go every hour, add 15 minutes. This means trying to wait 1 hour and 15 minutes between trips to the bathroom.  ?Keep following this schedule for several days. If you can do this for 3 or 4 days without problems, increase the time again. For example, you can add 15 more minutes between trips to the bathroom.  ?Keep doing this until you can wait at least 2 to 3 hours between bathroom visits. It can take time to get to this point. Some people notice improvement within a few weeks of bladder training. But it can take longer.  Keep filling out your bladder diary as you work on bladder training. This will help you see improvement over time. The process might take several weeks.   What else can I do  to help with urgency?  Part of bladder training involves learning to control the urge to urinate and make your bladder wait. Some things you can do to help with this:  ?Squeeze your pelvic muscles - These include the muscles that control the flow of urine. Try squeezing the muscles and then relaxing them. Repeat this several times.  ?Change positions - It might help to cross your legs or sit on a firm surface.  ?Distract your mind - Try to think about something else until the urge passes.  ?Relax - Stay still when you get the urge to urinate. It might help to do deep breathing exercises. Do not rush to the toilet when it is time to go.   What else should I know?  These tips might help with bladder training:  ?Try to only urinate when you actually need to go - For example, avoid going to the bathroom before leaving home just in case. This can train your brain to think that your bladder is full when it really isn't.  ?Avoid drinking fluids soon before bedtime - This can lower the chances that you will need to urinate during the night.  ?Limit or avoid alcohol and caffeine - Some people find that these things make them need to urinate more.  When should I call the doctor?  Call your doctor or nurse if:  ?Your urinary symptoms are  not getting better or are getting worse.  ?You are having trouble with your training schedule - Bladder training can be frustrating and take time. But don't give up. Your doctor or nurse can help you.  ?You have other problems with urinating, such as pain or burning, not being able to urinate, or seeing blood in your urine.   FOODS TO AVOID IF YOU HAVE AN OVERACTIVE BLADDER Alcoholic beverages (liquor, beer, wine) Brewer's Yeast Carbonated beverages (soda, seltzer water) Sports Drinks Tea Milk/milk products Sugar & artificial sweeteners Coffee (even decaffeinated) Tea - black or green, regular or decaffeinated Honey Medicines with  caffeine Chocolate Tomatoes and tomato-based products Citrus juice & fruits Corn Syrup Highly spiced foods/chilies Raw onion Saccharin Sour cream Soy sauce Strawberries Vinegar Vitamins buffered with aspartame  OVERACTIVE BLADDER DIET BETTER FOODS TO INCORPORATE Lean Proteins - fish, chicken breast, [place], low-fat beef, and pork are good options. Eggs are also a good source of protein if you're trying to avoid meat. Fiber-Rich Foods - these foods are filling and can help prevent constipation, which can put extra pressure on your bladder. Almonds, oats, pears, raspberries lentils, and beans are all good options when you want to add more fiber to your diet. Fruits - while some fruits, especially citrus, can irritate the bladder, it's still important to incorporate them into your diet. Bananas, apples, grapes, coconut, and watermelon are good options for those with overactive bladder. Vegetables - Leafy greens, like kale, lettuce, cucumber, squash, potatoes, broccoli, carrots, celery and bell peppers. Nuts Whole grains, like oats, barley, farro, and quinoa (also a great protein). You may wish to eliminate all the foods on the "do not eat" list, then slowly reintroduce them back into your diet one by one to determine which ones your bladder finds irritating.    Avoid: - Synthetic underwear - Tight pants - Swim suits, thongs, leotards, leggings for prolonged periods of time - Scented soap/shampoo - Bubble baths - Scented detergents, dryer sheets - Baby wipes - Feminine sprays, douches, powders - Panty liners - Dyed toilet paper - Shaving  Trying swapping out the above for: - Cotton or no underwear - Loose pants, skirts, dresses - Changing out of swimwear, thongs, and workout gear as soon as you're done exercising - Fragrance free soaps (like Dove sensitive skin) - Warm plain water baths - Unscented laundry detergent - Use a bedet or peri bottle to rinse instead of baby  wipes - Tampons, cotton pads, cotton period underwear - Undyed toilet paper - Clipping hair

## 2024-03-17 NOTE — Progress Notes (Signed)
   RETURN GYNECOLOGY VISIT  Subjective:  Jaime Leonard is a 33 y.o. [id number] with LMP 03/09/24 presenting for STI testing, vaginal odor and urinary symptoms  STI testing - boyfriend of 2 years cheated on her so she wants STI testing. They broke up and subsequently she lost her housing. Hasn't had stable housing since ~April. Currently living in a hotel, her SIL has a family/friends discount she is using. Vaginal odor - present x 3 weeks, different from odor she has had in the past Urinary symptoms - frequent urination, discomfort with urination and sensation of incomplete emptying L chest wall burning - comes and goes, has been evaluated by PCP. Not current.  NILM/HPV neg pap 05/27/23  Objective:   Vitals:   03/17/24 1424  BP: 113/76  Pulse: 91  Weight: 119 lb (54 kg)  Height: 5' 6 (1.676 m)   General:  Alert, oriented and cooperative. Patient is in no acute distress.  Skin: Skin is warm and dry. No rash noted.   Cardiovascular: Normal heart rate noted  Respiratory: Normal respiratory effort, no problems with respiration noted  Abdomen: Soft, non-tender, non-distended   Pelvic: NEFG. Cervix visually normal  Exam performed in the presence of a chaperone  Assessment and Plan:  Jaime Leonard is a 33 y.o. with the following  1. Screening examination for STD (sexually transmitted disease) (Primary) - Cervicovaginal ancillary only( [place]) - HIV antibody (with reflex) - HCV Ab w Reflex to Quant PCR; Future - Hepatitis B Surface AntiGEN - RPR  2. Vaginal discharge - Cervicovaginal ancillary only( [place])  3. Urinary frequency - POCT Urinalysis Dipstick - Urine Culture  4. Housing instability Discussed new platform for resources but need to review training and send message with what I can find. Will discuss next steps afterwards  Return if symptoms worsen or fail to improve.  Kieth JAYSON Carolin, MD

## 2024-03-18 LAB — HIV ANTIBODY (ROUTINE TESTING W REFLEX): HIV Screen 4th Generation wRfx: NONREACTIVE

## 2024-03-18 LAB — CERVICOVAGINAL ANCILLARY ONLY
Bacterial Vaginitis (gardnerella): POSITIVE — AB
Candida Glabrata: NEGATIVE
Candida Vaginitis: NEGATIVE
Chlamydia: NEGATIVE
Comment: NEGATIVE
Comment: NEGATIVE
Comment: NEGATIVE
Comment: NEGATIVE
Comment: NEGATIVE
Comment: NORMAL
Neisseria Gonorrhea: NEGATIVE
Trichomonas: NEGATIVE

## 2024-03-18 LAB — RPR: RPR Ser Ql: NONREACTIVE

## 2024-03-18 LAB — HEPATITIS B SURFACE ANTIGEN: Hepatitis B Surface Ag: NEGATIVE

## 2024-03-20 ENCOUNTER — Ambulatory Visit: Payer: Self-pay | Admitting: Obstetrics and Gynecology

## 2024-03-20 LAB — URINE CULTURE

## 2024-03-20 MED ORDER — NITROFURANTOIN MONOHYD MACRO 100 MG PO CAPS: 100.0000 mg | ORAL_CAPSULE | Freq: Two times a day (BID) | ORAL | 0 refills | Status: AC

## 2024-03-20 MED ORDER — METRONIDAZOLE 0.75 % VA GEL: 1.0000 | Freq: Every day | VAGINAL | 0 refills | Status: AC

## 2024-03-20 NOTE — Addendum Note (Signed)
 Addended by: ERIK FELTS on: 03/20/2024 05:21 PM   Modules accepted: Orders

## 2024-04-02 ENCOUNTER — Other Ambulatory Visit (HOSPITAL_COMMUNITY): Payer: Self-pay

## 2024-04-02 MED ORDER — BUPROPION HCL ER (XL) 300 MG PO TB24
300.0000 mg | ORAL_TABLET | Freq: Every day | ORAL | 0 refills | Status: DC
  Filled 2024-04-02 – 2024-05-06 (×2): qty 90, 90d supply, fill #0

## 2024-04-02 MED ORDER — AMPHETAMINE-DEXTROAMPHET ER 30 MG PO CP24
30.0000 mg | ORAL_CAPSULE | Freq: Every morning | ORAL | 0 refills | Status: AC
  Filled 2024-04-02: qty 30, 30d supply, fill #0

## 2024-04-02 MED ORDER — AMPHETAMINE-DEXTROAMPHET ER 30 MG PO CP24: 30.0000 mg | ORAL_CAPSULE | Freq: Every morning | ORAL | 0 refills | Status: DC

## 2024-04-02 MED ORDER — AMPHETAMINE-DEXTROAMPHET ER 30 MG PO CP24
30.0000 mg | ORAL_CAPSULE | Freq: Every morning | ORAL | 0 refills | Status: DC
  Filled 2024-05-06: qty 30, 30d supply, fill #0

## 2024-05-06 ENCOUNTER — Other Ambulatory Visit (HOSPITAL_COMMUNITY): Payer: Self-pay

## 2024-05-06 ENCOUNTER — Encounter: Admitting: Obstetrics and Gynecology

## 2024-05-27 ENCOUNTER — Other Ambulatory Visit (HOSPITAL_COMMUNITY): Payer: Self-pay

## 2024-05-27 MED ORDER — AMPHETAMINE-DEXTROAMPHET ER 30 MG PO CP24
30.0000 mg | ORAL_CAPSULE | Freq: Every morning | ORAL | 0 refills | Status: DC
  Filled ????-??-??: fill #0

## 2024-06-03 ENCOUNTER — Other Ambulatory Visit (HOSPITAL_COMMUNITY): Payer: Self-pay

## 2024-06-03 MED ORDER — AMPHETAMINE-DEXTROAMPHET ER 30 MG PO CP24
30.0000 mg | ORAL_CAPSULE | Freq: Every morning | ORAL | 0 refills | Status: DC
  Filled 2024-06-03: qty 30, 30d supply, fill #0

## 2024-07-08 ENCOUNTER — Other Ambulatory Visit (HOSPITAL_COMMUNITY): Payer: Self-pay

## 2024-07-08 MED ORDER — AMPHETAMINE-DEXTROAMPHET ER 30 MG PO CP24
30.0000 mg | ORAL_CAPSULE | Freq: Every morning | ORAL | 0 refills | Status: DC
  Filled 2024-07-08: qty 7, 7d supply, fill #0

## 2024-07-08 MED ORDER — BUPROPION HCL ER (XL) 300 MG PO TB24
300.0000 mg | ORAL_TABLET | Freq: Every day | ORAL | 0 refills | Status: AC
  Filled 2024-07-08 – 2024-09-21 (×4): qty 90, 90d supply, fill #0

## 2024-07-12 ENCOUNTER — Other Ambulatory Visit (HOSPITAL_COMMUNITY): Payer: Self-pay

## 2024-07-12 MED ORDER — AMPHETAMINE-DEXTROAMPHET ER 30 MG PO CP24
30.0000 mg | ORAL_CAPSULE | Freq: Every morning | ORAL | 0 refills | Status: AC
  Filled 2024-08-11 – 2024-08-12 (×2): qty 30, 30d supply, fill #0

## 2024-07-12 MED ORDER — AMPHETAMINE-DEXTROAMPHET ER 30 MG PO CP24
30.0000 mg | ORAL_CAPSULE | Freq: Every morning | ORAL | 0 refills | Status: AC
  Filled 2024-07-12 – 2024-07-15 (×2): qty 30, 30d supply, fill #0

## 2024-07-12 MED ORDER — AMPHETAMINE-DEXTROAMPHET ER 30 MG PO CP24
30.0000 mg | ORAL_CAPSULE | Freq: Every morning | ORAL | 0 refills | Status: AC
  Filled 2024-09-21: qty 30, 30d supply, fill #0

## 2024-07-13 ENCOUNTER — Other Ambulatory Visit (HOSPITAL_COMMUNITY): Payer: Self-pay

## 2024-07-15 ENCOUNTER — Other Ambulatory Visit (HOSPITAL_COMMUNITY): Payer: Self-pay

## 2024-08-10 ENCOUNTER — Other Ambulatory Visit (HOSPITAL_COMMUNITY): Payer: Self-pay

## 2024-08-11 ENCOUNTER — Other Ambulatory Visit (HOSPITAL_COMMUNITY): Payer: Self-pay

## 2024-08-12 ENCOUNTER — Other Ambulatory Visit (HOSPITAL_COMMUNITY): Payer: Self-pay

## 2024-09-02 ENCOUNTER — Other Ambulatory Visit (HOSPITAL_COMMUNITY): Payer: Self-pay

## 2024-09-16 ENCOUNTER — Other Ambulatory Visit (HOSPITAL_COMMUNITY): Payer: Self-pay

## 2024-09-21 ENCOUNTER — Other Ambulatory Visit (HOSPITAL_COMMUNITY): Payer: Self-pay

## 2024-11-03 ENCOUNTER — Ambulatory Visit: Admitting: Obstetrics and Gynecology
